# Patient Record
Sex: Male | Born: 1960 | Race: White | Hispanic: No | Smoking: Never smoker
Health system: Southern US, Community
[De-identification: ages and names within clinical notes are randomized; demographics above are authoritative.]

## PROBLEM LIST (undated history)

## (undated) DIAGNOSIS — I7789 Other specified disorders of arteries and arterioles: Secondary | ICD-10-CM

## (undated) DIAGNOSIS — Z8701 Personal history of pneumonia (recurrent): Secondary | ICD-10-CM

## (undated) DIAGNOSIS — G20 Parkinson's disease: Secondary | ICD-10-CM

## (undated) DIAGNOSIS — R7989 Other specified abnormal findings of blood chemistry: Secondary | ICD-10-CM

## (undated) DIAGNOSIS — E079 Disorder of thyroid, unspecified: Secondary | ICD-10-CM

## (undated) HISTORY — PX: CHOLECYSTECTOMY: SHX55

## (undated) HISTORY — PX: LASIK: SHX215

---

## 2015-10-19 ENCOUNTER — Emergency Department (HOSPITAL_COMMUNITY): Payer: Self-pay

## 2015-10-19 ENCOUNTER — Emergency Department (HOSPITAL_COMMUNITY)
Admission: EM | Admit: 2015-10-19 | Discharge: 2015-10-19 | Disposition: A | Discharge status: Home / Self Care | Payer: Self-pay | Attending: Emergency Medicine | Admitting: Emergency Medicine

## 2015-10-19 ENCOUNTER — Encounter (HOSPITAL_COMMUNITY): Payer: Self-pay

## 2015-10-19 DIAGNOSIS — Z8679 Personal history of other diseases of the circulatory system: Secondary | ICD-10-CM | POA: Insufficient documentation

## 2015-10-19 DIAGNOSIS — E291 Testicular hypofunction: Secondary | ICD-10-CM | POA: Insufficient documentation

## 2015-10-19 DIAGNOSIS — G20 Parkinson's disease: Secondary | ICD-10-CM | POA: Insufficient documentation

## 2015-10-19 DIAGNOSIS — B303 Acute epidemic hemorrhagic conjunctivitis (enteroviral): Secondary | ICD-10-CM

## 2015-10-19 DIAGNOSIS — Z79899 Other long term (current) drug therapy: Secondary | ICD-10-CM | POA: Insufficient documentation

## 2015-10-19 DIAGNOSIS — H1132 Conjunctival hemorrhage, left eye: Secondary | ICD-10-CM | POA: Insufficient documentation

## 2015-10-19 DIAGNOSIS — E079 Disorder of thyroid, unspecified: Secondary | ICD-10-CM | POA: Insufficient documentation

## 2015-10-19 HISTORY — DX: Other specified abnormal findings of blood chemistry: R79.89

## 2015-10-19 HISTORY — DX: Disorder of thyroid, unspecified: E07.9

## 2015-10-19 HISTORY — DX: Parkinson's disease: G20

## 2015-10-19 HISTORY — DX: Other specified disorders of arteries and arterioles: I77.89

## 2015-10-19 LAB — CBC WITH DIFFERENTIAL/PLATELET
Basophils Absolute: 0 10*3/uL (ref 0.0–0.1)
Basophils Relative: 0 %
EOS ABS: 0.1 10*3/uL (ref 0.0–0.7)
Eosinophils Relative: 2 %
HEMATOCRIT: 47.5 % (ref 39.0–52.0)
HEMOGLOBIN: 15.8 g/dL (ref 13.0–17.0)
LYMPHS ABS: 1.4 10*3/uL (ref 0.7–4.0)
LYMPHS PCT: 26 %
MCH: 30.6 pg (ref 26.0–34.0)
MCHC: 33.3 g/dL (ref 30.0–36.0)
MCV: 92.1 fL (ref 78.0–100.0)
MONOS PCT: 17 %
Monocytes Absolute: 0.9 10*3/uL (ref 0.1–1.0)
NEUTROS PCT: 55 %
Neutro Abs: 3.1 10*3/uL (ref 1.7–7.7)
Platelets: 187 10*3/uL (ref 150–400)
RBC: 5.16 MIL/uL (ref 4.22–5.81)
RDW: 12.8 % (ref 11.5–15.5)
WBC: 5.5 10*3/uL (ref 4.0–10.5)

## 2015-10-19 LAB — BASIC METABOLIC PANEL
Anion gap: 9 (ref 5–15)
BUN: 10 mg/dL (ref 6–20)
CHLORIDE: 102 mmol/L (ref 101–111)
CO2: 26 mmol/L (ref 22–32)
CREATININE: 0.98 mg/dL (ref 0.61–1.24)
Calcium: 9 mg/dL (ref 8.9–10.3)
GFR calc non Af Amer: >60 mL/min (ref >60)
Glucose, Bld: 80 mg/dL (ref 65–99)
POTASSIUM: 3.7 mmol/L (ref 3.5–5.1)
SODIUM: 137 mmol/L (ref 135–145)

## 2015-10-19 LAB — I-STAT CREATININE, ED: CREATININE: 1.1 mg/dL (ref 0.61–1.24)

## 2015-10-19 MED ORDER — CARBIDOPA-LEVODOPA 25-100 MG PO TABS: 1.0000 | ORAL_TABLET | Freq: Four times a day (QID) | ORAL | Status: DC

## 2015-10-19 MED ORDER — FLUORESCEIN SODIUM 1 MG OP STRP
1.0000 | ORAL_STRIP | Freq: Once | OPHTHALMIC | Status: AC
  Administered 2015-10-19: 1 via OPHTHALMIC
  Filled 2015-10-19: qty 1

## 2015-10-19 MED ORDER — ROPINIROLE HCL 1 MG PO TABS
8.0000 mg | ORAL_TABLET | Freq: Two times a day (BID) | ORAL | Status: DC
  Filled 2015-10-19: qty 8

## 2015-10-19 MED ORDER — PROPARACAINE HCL 0.5 % OP SOLN
1.0000 [drp] | Freq: Once | OPHTHALMIC | Status: AC
  Administered 2015-10-19: 1 [drp] via OPHTHALMIC
  Filled 2015-10-19: qty 15

## 2015-10-19 MED ORDER — IOHEXOL 300 MG/ML  SOLN
75.0000 mL | Freq: Once | INTRAMUSCULAR | Status: AC | PRN
  Administered 2015-10-19: 75 mL via INTRAVENOUS

## 2015-10-19 MED ORDER — POLYMYXIN B-TRIMETHOPRIM 10000-0.1 UNIT/ML-% OP SOLN: 2.0000 [drp] | Freq: Four times a day (QID) | OPHTHALMIC | Status: DC

## 2015-10-19 MED ORDER — ROPINIROLE HCL ER 8 MG PO TB24: 8.0000 mg | ORAL_TABLET | Freq: Two times a day (BID) | ORAL | Status: DC

## 2015-10-19 MED ORDER — CARBIDOPA-LEVODOPA 25-100 MG PO TABS
1.0000 | ORAL_TABLET | Freq: Four times a day (QID) | ORAL | Status: DC
  Administered 2015-10-19: 1 via ORAL
  Filled 2015-10-19 (×3): qty 1

## 2015-10-19 NOTE — ED Provider Notes (Signed)
CSN: [id number]     Arrival date & time 10/19/15  1557 History  By signing my name below, I, [name], attest that this documentation has been prepared under the direction and in the presence of non-physician practitioner, Arthor Captain, PA-C. Electronically Signed: [name], Scribe. 10/19/2015. 5:53 PM.  Chief Complaint  Patient presents with  . eye redness     The history is provided by the patient. No language interpreter was used.     HPI Comments:  [name] [name] is a 55 y.o. male who presents to the Emergency Department complaining of moderate-severe left eye and eyelid redness that has progressively worsened since onset 4 days. He notes associated  mild throbbing eye pain when he touches the eye, mild photophobia, and watering of the eye. He has been evaluated at urgent care and was discharged with antibiotic, tobramycin, which he has been using without relief. He denies injury. Pt also denies use of contacts but notes he wears corrective lenses. No alleviating factors noted. Patient remarks that he has had some visual changes over the past 3 months that he thought was due to his parkisons. He descries it as "problems focusing."   Past Medical History  Diagnosis Date  . Parkinson's disease (HCC)   . Enlarged aorta (HCC)   . Thyroid disease   . Low testosterone    Past Surgical History  Procedure Laterality Date  . Cholecystectomy     Family History  Problem Relation Age of Onset  . Diabetes Mother   . Hypertension Mother    Social History  Substance Use Topics  . Smoking status: Never Smoker   . Smokeless tobacco: Never Used  . Alcohol Use: Yes     Comment: rarely    Review of Systems  Constitutional: Negative for fever and chills.  Eyes: Positive for photophobia, pain, discharge and redness.  Ten systems reviewed and are negative for acute change, except as noted in the HPI.   Allergies  Review of patient's allergies indicates no known  allergies.  Home Medications   Prior to Admission medications   Medication Sig Start Date End Date Taking? Authorizing Provider  carbidopa-levodopa (SINEMET IR) 25-100 MG tablet Take 1 tablet by mouth 4 (four) times daily.   Yes Historical Provider, MD  levothyroxine (SYNTHROID, LEVOTHROID) 50 MCG tablet Take 50 mcg by mouth daily before breakfast.   Yes Historical Provider, MD  PARoxetine (PAXIL) 20 MG tablet Take 20 mg by mouth daily.   Yes Historical Provider, MD  rOPINIRole (REQUIP XL) 8 MG 24 hr tablet Take 8 mg by mouth 2 (two) times daily.   Yes Historical Provider, MD  testosterone (ANDROGEL) 50 MG/5GM (1%) GEL Place 5 g onto the skin daily.   Yes Historical Provider, MD   BP 146/80 mmHg  Pulse 71  Temp(Src) 97.8 F (36.6 C) (Oral)  Resp 16  Ht  (1.854 m)  Wt 320 lb (145.151 kg)  BMI 42.23 kg/m2  SpO2 97% Physical Exam  Constitutional: He is oriented to person, place, and time. He appears well-developed and well-nourished. No distress.  HENT:  Head: Normocephalic and atraumatic.      Eyes: Conjunctivae are normal. No scleral icterus.  Visual acuity/ Pressure OD:20/25; OS: 20/30;  Left eye with sig swellig and erythema of the upper lid There is violaceous discoloration at the medial canthus in along the border of the inferior orbit similar to a " black eye" Impressive chemosis with diffuse subcojunctival hemorrhage. The chemosis protrudes  from the eye and the patient is unable to fully shut the lid because of the swelling. He is EOMI, PERRL and no photophobia No fluorescein uptake on exam.   Exam is limited due to severe swelling.  Neck: Normal range of motion. Neck supple.  Cardiovascular: Normal rate, regular rhythm and normal heart sounds.   Pulmonary/Chest: Effort normal and breath sounds normal. No respiratory distress.  Abdominal: Soft. He exhibits no distension. There is no tenderness.  Musculoskeletal: He exhibits no edema.  Neurological:  He is alert and oriented to person, place, and time.  Skin: Skin is warm and dry. He is not diaphoretic.  Psychiatric: He has a normal mood and affect. His behavior is normal.  Nursing note and vitals reviewed.   ED Course  Procedures   DIAGNOSTIC STUDIES:  Oxygen Saturation is 97% on RA, normal by my interpretation.    COORDINATION OF CARE:  5:35 PM Discussed treatment plan with pt at bedside and pt agreed to plan.  Labs Review Labs Reviewed - No data to display  Imaging Review No results found. I have personally reviewed and evaluated these images and lab results as part of my medical decision-making.    MDM   Final diagnoses:  None    Patiet with sig eye complaint as described above. No overt proptosis.  Patient seen in shared visit with attending physician. Will check CT orits to r/o retro orbital mass or infection. i have given sign out to [employer].  I personally performed the services described in this documentation, which was scribed in my presence. The recorded information has been reviewed and is accurate.         Arthor Captain, PA-C 10/19/15 2036  Azalia Bilis, MD 10/20/15 [phone]

## 2015-10-19 NOTE — Discharge Instructions (Signed)
Viral Conjunctivitis Viral conjunctivitis is an inflammation of the clear membrane that covers the white part of your eye and the inner surface of your eyelid (conjunctiva). The inflammation is caused by a viral infection. The blood vessels in the conjunctiva become inflamed, causing the eye to become red or pink, and often itchy. Viral conjunctivitis can easily be passed from one person to another (contagious). CAUSES  Viral conjunctivitis is caused by a virus. A virus is a type of contagious germ. It can be spread by touching objects that have been contaminated with the virus, such as doorknobs or towels.  SYMPTOMS  Symptoms of viral conjunctivitis may include:   Eye redness.  Tearing or watery eyes.  Itchy eyes.  Burning feeling in the eyes.  Clear drainage from the eye.  Swollen eyelids.  A gritty feeling in the eye.  Light sensitivity. DIAGNOSIS  Viral conjunctivitis may be diagnosed with a medical history and physical exam. If you have discharge from your eye, the discharge may be tested to rule out other causes of conjunctivitis.  TREATMENT  Viral conjunctivitis does not respond to medicines that kill bacteria (antibiotics). Treatment for viral conjunctivitis is directed at stopping a bacterial infection from developing in addition to the viral infection. Treatment also aims to relieve your symptoms, such as itching. This may be done with antihistamine drops or other eye medicines. HOME CARE INSTRUCTIONS  Take medicines only as directed by your health care provider.  Avoid touching or rubbing your eyes.  Apply a warm, clean washcloth to your eye for 10-20 minutes, 3-4 times per day.  If you wear contact lenses, do not wear them until the inflammation is gone and your health care provider says it is safe to wear them again. Ask your health care provider how to sterilize or replace your contact lenses before using them again. Wear glasses until you can resume wearing  contacts.  Avoid wearing eye makeup until the inflammation is gone. Throw away any old eye cosmetics that may be contaminated.  Change or wash your pillowcase every day.  Do not share towels or washcloths. This may spread the infection.  Wash your hands often with soap and water. Use paper towels to dry your hands.  Gently wipe away any drainage from your eye with a warm, wet washcloth or a cotton ball.  Be very careful to avoid touching the edge of the eyelid with the eye drop bottle or ointment tube when applying medicines to the affected eye. This will stop you from spreading the infection to the other eye or to other people. SEEK MEDICAL CARE IF:   Your symptoms do not improve with treatment.  You have increased pain.  Your vision becomes blurry.  You have a fever.  You have facial pain, redness, or swelling.  You have new symptoms.  Your symptoms get worse.   This information is not intended to replace advice given to you by your health care provider. Make sure you discuss any questions you have with your health care provider.   Document Released: 11/10/2002 Document Revised: 02/11/2006 Document Reviewed: 06/01/2014 Elsevier Interactive Patient Education 2016 [employer]  

## 2015-10-19 NOTE — ED Notes (Signed)
Patient c/o eye redness and swelling x 3 days. Patient went to an UC and was given Tobrax 2 days ago. Patient states the redness and swelling has gotten worse.

## 2015-10-19 NOTE — ED Provider Notes (Signed)
Patient to the ER seen by Arthor Captain, PA-C for severe left eye and eyelid redness and swelling that has been worsening over the past 4 days. He has pain associated, mild photophobia, and watering of the eye. He was seen at the UC and given Tobramycin drops but it has not helped. He has had visual changes over the past 3 months which he has attributed to his Parkinsons' disease.  "Visual acuity/ Pressure OD:20/25; OS: 20/30;  Left eye with sig swellig and erythema of the upper lid There is violaceous discoloration at the medial canthus in along the border of the inferior orbit similar to a " black eye" Impressive chemosis with diffuse subcojunctival hemorrhage. The chemosis protrudes from the eye and the patient is unable to fully shut the lid because of the swelling. He is EOMI, PERRL and no photophobia No fluorescein uptake on exam.  Exam is limited due to severe swelling."   CT Orbits W/CM (Final result) Result time: 10/19/15 20:26:33   Final result by Rad Results In Interface (10/19/15 20:26:33)   Narrative:   CLINICAL DATA: 55 year old male with throbbing left eye pain, and left eyelid redness and swelling.  EXAM: CT ORBITS WITH CONTRAST  TECHNIQUE: Multidetector CT imaging of the orbits was performed following the bolus administration of intravenous contrast.  CONTRAST: 75mL OMNIPAQUE IOHEXOL 300 MG/ML SOLN  COMPARISON: None.  FINDINGS: Hyperemia and thickening of the left palpebra. Additionally, there is focal fluid containing a few small foci of air/gas in the region of the anterior chamber of the eye. The lens and posterior chamber are intact. The retrobulbar fat is pristine. No evidence of orbital extension. The right RA is unremarkable. No metallic or dense foreign body identified. The remainder of the imaged face and intracranial contents are unremarkable. The visualized mastoid air cells and paranasal sinuses are well aerated and  normal.  IMPRESSION: 1. Hyperemia and edema of the left palpebra consistent with edema or cellulitis. 2. Abnormal fluid and gas in the anterior aspect of the left globe. This may represent focal hydrops or blistering of the cornea, or potentially an infectious/inflammatory process in the anterior chamber. There is no evidence of extension of the process into the posterior chamber or orbit.   Electronically Signed By: Malachy Moan M.D. On: 10/19/2015 20:26    Dr. Randon Goldsmith, Cheree Ditto, hemorrhagic conjunctivitis - Viral,  polytrim QID, cold compresses for comfort, Motrin, Ibuprofen. Vivene. Can follow-up with regular Optometrist or with DR. Lyles. This infection can last weeks.  Discussed this with patient and given return precautions.   Filed Vitals:   10/19/15 1627 10/19/15 1829  BP: 146/80 163/97  Pulse: 71 64  Temp: 97.8 F (36.6 C)   Resp: 16 [address] PA-C 10/19/15 2139  Azalia Bilis, MD 10/20/15 [phone]

## 2015-11-17 DIAGNOSIS — I7789 Other specified disorders of arteries and arterioles: Secondary | ICD-10-CM | POA: Insufficient documentation

## 2016-04-11 ENCOUNTER — Encounter: Payer: Self-pay | Admitting: Neurology

## 2016-04-16 ENCOUNTER — Ambulatory Visit (INDEPENDENT_AMBULATORY_CARE_PROVIDER_SITE_OTHER): Payer: BC Managed Care – PPO | Admitting: Neurology

## 2016-04-16 ENCOUNTER — Encounter: Payer: Self-pay | Admitting: Neurology

## 2016-04-16 VITALS — BP 134/80 | HR 73 | Ht 73.0 in | Wt 335.0 lb

## 2016-04-16 DIAGNOSIS — G20A1 Parkinson's disease without dyskinesia, without mention of fluctuations: Secondary | ICD-10-CM

## 2016-04-16 DIAGNOSIS — G20 Parkinson's disease: Secondary | ICD-10-CM

## 2016-04-16 MED ORDER — ROPINIROLE HCL 5 MG PO TABS: 5.0000 mg | ORAL_TABLET | Freq: Three times a day (TID) | ORAL | 3 refills | Status: DC

## 2016-04-16 MED ORDER — CARBIDOPA-LEVODOPA ER 50-200 MG PO TBCR: 1.0000 | EXTENDED_RELEASE_TABLET | Freq: Every day | ORAL | 1 refills | Status: DC

## 2016-04-16 NOTE — Patient Instructions (Addendum)
1. Continue Carbidopa Levodopa IR 25/100 - 1 tablet at 5 am, 1 tablet at 9 am, 1 tablet at 2 pm, 1 tablet at 5 pm Start Carbidopa Levodopa 50/200 at bedtime  2. Stop XL Requip Start Requip 5 mg tablets - 1 tablet three times daily  3. Follow up in 3 months
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
## 2016-07-14 NOTE — Progress Notes (Deleted)
[name] [name] was seen today in the movement disorders clinic for neurologic consultation at the request of [name],[name], MD.  The consultation is for the evaluation of PD.  He was previously seen in [place] (but that was about 5 years ago) and seen for a one time visit 11/17/15 with Dr. [name] but there is no note in Care Everywhere and when we called his office we were told to call medical records [facility] and when we called medical records, we were told to call Dr. [name].  Pt reports that his first sx was when he was about 55 years old and that was R toe tapping/R toe tremor followed by a sense of stiffness.  He was seen at [facility].   He was first placed on requip XL and worked ultimately to requip XL 8 mg bid.  This was reduced because of LE edema by Dr. [name] but he reports that he was placed on lasix recently and that has helped the LE edema.  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   He is also on carbidopa/levodopa 25/100 tid-qid (5am/10:30/4pm/sometimes before bed).  He was on Azilect for a while but has been off that since he left [place] about 5 years ago.    07/17/16 update:  Pt f/u today.  We changed him from requip XL 8 mg bid to requip 5 mg tid (primarily because of issues obtaining the XL).  I also increased his levodopa and asked him to take carbidopa/levodopa 25/100 at 5 AM/9-10 AM/1-2pm/5-6pm and then we added carbidopa/levodopa 50/200 at night as he was having toe cramping and right leg pulling at night.  He states that ***.  Neuroimaging has previously been performed.  It is not available for my review today.  States that it was normal  PREVIOUS MEDICATIONS: Sinemet, Requip and Azilect, paxil  ALLERGIES:  No Known Allergies  CURRENT MEDICATIONS:  Outpatient Encounter Prescriptions as of 07/17/2016  Medication Sig  . carbidopa-levodopa (SINEMET CR) 50-200 MG  tablet Take 1 tablet by mouth at bedtime.  . carbidopa-levodopa (SINEMET IR) 25-100 MG tablet Take 1 tablet by mouth 4 (four) times daily.  . clonazePAM (KLONOPIN) 0.5 MG tablet 1 pill qhs prn  . furosemide (LASIX) 20 MG tablet Take 20 mg by mouth daily.  . ropinirole (REQUIP) 5 MG tablet Take 1 tablet (5 mg total) by mouth 3 (three) times daily.  . sildenafil (VIAGRA) 100 MG tablet Take 100 mg by mouth as needed for erectile dysfunction.   No facility-administered encounter medications on file as of 07/17/2016.     PAST MEDICAL HISTORY:   Past Medical History:  Diagnosis Date  . Enlarged aorta (HCC)   . Low testosterone   . Parkinson's disease (HCC)   . Thyroid disease     PAST SURGICAL HISTORY:   Past Surgical History:  Procedure Laterality Date  . CHOLECYSTECTOMY    . LASIK      SOCIAL HISTORY:   Social History   Social History  . Marital status: Legally Separated    Spouse name: N/A  . Number of children: N/A  . Years of education: N/A   Occupational History  . dentist     Ambulance personCU instructor   Social History Main Topics  . Smoking status: Never Smoker  . Smokeless tobacco: Never Used  . Alcohol use Yes     Comment:  once a week  . Drug use: No  . Sexual activity: Not on file   Other Topics Concern  . Not on file   Social History Narrative  . No narrative on file    FAMILY HISTORY:   Family Status  Relation Status  . Mother Alive  . Father Deceased  . Sister Alive   2, healthy  . Brother Deceased at age 149  . Brother Alive    ROS:  Some DOE that attributes to being out of shape.  Feels that R side weaker than the left.  A complete 10 system review of systems was obtained and was unremarkable apart from what is mentioned above.  PHYSICAL EXAMINATION:    VITALS:   There were no vitals filed for this visit.  GEN:  The patient appears stated age and is in NAD. HEENT:  Normocephalic, atraumatic.  The mucous membranes are moist. The superficial  temporal arteries are without ropiness or tenderness. CV:  RRR Lungs:  CTAB Neck/HEME:  There are no carotid bruits bilaterally.  PT DID NOT TAKE HIS PD MEDS TODAY   Neurological examination:  Orientation: The patient is alert and oriented x3. Fund of knowledge is appropriate.  Recent and remote memory are intact.  Attention and concentration are normal.    Able to name objects and repeat phrases. Cranial nerves: There is good facial symmetry. There is significant facial hypomimia.  Pupils are equal round and reactive to light bilaterally. Fundoscopic exam reveals clear margins bilaterally. Extraocular muscles are intact. The visual fields are full to confrontational testing. The speech is fluent and clear. Soft palate rises symmetrically and there is no tongue deviation. Hearing is intact to conversational tone. Sensation: Sensation is intact to light and pinprick throughout (facial, trunk, extremities). Vibration is intact at the bilateral big toe. There is no extinction with double simultaneous stimulation. There is no sensory dermatomal level identified. Motor: Strength is 5/5 in the bilateral upper and lower extremities.   Shoulder shrug is equal and symmetric.  There is no pronator drift. Deep tendon reflexes: Deep tendon reflexes are 1/4 at the bilateral biceps, triceps, brachioradialis, patella and achilles. Plantar responses are downgoing bilaterally.  Movement examination: Tone: There is mod increased tone in the RUE and RLE.  There is mild increased tone in the LUE and normal tone in the LLE.  upper extremities.  Abnormal movements: There is RLE tremor.  There is rare LUE tremor. Coordination:  There is decremation with RAM's, with any form of RAMS on the right, including alternating supination and pronation of the forearm, hand opening and closing, finger taps, heel taps and toe taps and also seen with finger taps and alternation of the supination/pronation of the forearm on the L Gait  and Station: The patient has no difficulty arising out of a deep-seated chair without the use of the hands. The patient's stride length is normal with decreased arm swing on the R.  The patient has a negative pull test.      ASSESSMENT/PLAN:  1.  Idiopathic Parkinson's disease.  The patient has tremor, bradykinesia, rigidity and mild postural instability.  -We discussed the diagnosis as well as pathophysiology of the disease.  We discussed treatment options as well as prognostic indicators.  Patient education was provided.  -We decided to continue his carbidopa/levodopa 25/100 IR at 5 AM/9-10 AM/1-2pm/5-6pm  -continue carbidopa/levodopa 50/200 at night for toe cramping and right leg pulling at night.  -continue requip 5 mg tid.  -We discussed community  resources in the area including patient support groups and community exercise programs for PD and pt education was provided to the patient.  He works full time, so he really could only do the rock study boxing and ACT gym and he was given information on those programs.  -He asked me about DBS therapy.  I talked to him about that therapy.  He is experiencing some freezing and motor fluctuations, including on/off, so he may be a candidate, but I want to rework his medications first.  In addition, he works full time, and most patients do not resume full-time employment following DBS therapy.  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  2.  Follow up is anticipated in the next few months, sooner should new neurologic issues arise.

## 2016-07-17 ENCOUNTER — Ambulatory Visit: Payer: BC Managed Care – PPO | Admitting: Neurology

## 2016-09-25 NOTE — Progress Notes (Signed)
[name] [name] was seen today in the movement disorders clinic for neurologic consultation at the request of [name],[name], MD.  The consultation is for the evaluation of PD.  He was previously seen in [place] (but that was about 5 years ago) and seen for a one time visit 11/17/15 with Dr. Rubin Payor but there is no note in Care Everywhere and when we called his office we were told to call medical records at [facility] and when we called medical records, we were told to call Dr. Rubin Payor.  Pt reports that his first sx was when he was about 56 years old and that was R toe tapping/R toe tremor followed by a sense of stiffness.  He was seen at [facility]   He was first placed on requip XL and worked ultimately to requip XL 8 mg bid.  This was reduced because of LE edema by Dr. Rubin Payor but he reports that he was placed on lasix recently and that has helped the LE edema.  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   He is also on carbidopa/levodopa 25/100 tid-qid (5am/10:30/4pm/sometimes before bed).  He was on Azilect for a while but has been off that since he left [place] about 5 years ago.    09/27/16 update:  Patient returns today for follow-up.  We changed him from Requip XL to regular ropinirole and he is on 5 mg 3 times per day (but about 4 days a week he is taking 4 of them).  He denies compulsive behaviors or sleep attacks.  I encouraged him last visit to take carbidopa/levodopa 25/100 at the following times: 5 AM/9-10 AM/1-2pm/5-6pm (states that he is actually taking 2 po tid-qid) and then we added carbidopa/levodopa 50/200 at night as he was having toe cramping and right leg pulling at night (he takes that about 3 nights a week). Toe curling is some better.  Dreaming at night is a bit better.   He states that meds wear off after about 2 hours and he doesn't feel great and he becomes more stiff and finds it  difficult to do his work.  Pt denies falls.  Pt denies lightheadedness, near syncope.  No hallucinations.  Mood has been good.  Neuroimaging has previously been performed.  It is not available for my review today.  States that it was normal  PREVIOUS MEDICATIONS: Sinemet, Requip and Azilect, paxil  ALLERGIES:  No Known Allergies  CURRENT MEDICATIONS:  Outpatient Encounter Prescriptions as of 09/27/2016  Medication Sig  . carbidopa-levodopa (SINEMET CR) 50-200 MG tablet Take 1 tablet by mouth at bedtime.  . carbidopa-levodopa (SINEMET IR) 25-100 MG tablet Take 1 tablet by mouth 4 (four) times daily.  . clonazePAM (KLONOPIN) 0.5 MG tablet 1 pill qhs prn  . furosemide (LASIX) 20 MG tablet Take 20 mg by mouth daily.  . ropinirole (REQUIP) 5 MG tablet Take 1 tablet (5 mg total) by mouth 3 (three) times daily.  . sildenafil (VIAGRA) 100 MG tablet Take 100 mg by mouth as needed for erectile dysfunction.   No facility-administered encounter medications on file as of 09/27/2016.     PAST MEDICAL HISTORY:   Past Medical History:  Diagnosis Date  . Enlarged aorta (HCC)   . Low testosterone   . Parkinson's disease (HCC)   . Thyroid disease     PAST SURGICAL HISTORY:   Past Surgical  History:  Procedure Laterality Date  . CHOLECYSTECTOMY    . LASIK      SOCIAL HISTORY:   Social History   Social History  . Marital status: Legally Separated    Spouse name: N/A  . Number of children: N/A  . Years of education: N/A   Occupational History  . dentist     Ambulance personCU instructor   Social History Main Topics  . Smoking status: Never Smoker  . Smokeless tobacco: Never Used  . Alcohol use Yes     Comment: once a week  . Drug use: No  . Sexual activity: Not on file   Other Topics Concern  . Not on file   Social History Narrative  . No narrative on file    FAMILY HISTORY:   Family Status  Relation Status  . Mother Alive  . Father Deceased  . Sister Alive   2, healthy  . Brother  Deceased at age 57  . Brother Alive    ROS:  Some DOE that attributes to being out of shape. A complete 10 system review of systems was obtained and was unremarkable apart from what is mentioned above.  PHYSICAL EXAMINATION:    VITALS:   Vitals:   09/27/16 0805  BP: 130/86  Pulse: 73  Weight: (!) 340 lb (154.2 kg)  Height: 6\' 1"  (1.854 m)    GEN:  The patient appears stated age and is in NAD. HEENT:  Normocephalic, atraumatic.  The mucous membranes are moist. The superficial temporal arteries are without ropiness or tenderness. CV:  RRR Lungs:  CTAB.  Has DOE Neck/HEME:  There are no carotid bruits bilaterally.  Neurological examination:  Orientation: The patient is alert and oriented x3.  Cranial nerves: There is good facial symmetry. There is significant facial hypomimia.  Pupils are equal round and reactive to light bilaterally. Fundoscopic exam reveals clear margins bilaterally. Extraocular muscles are intact. The visual fields are full to confrontational testing. The speech is fluent and clear. Soft palate rises symmetrically and there is no tongue deviation. Hearing is intact to conversational tone. Sensation: Sensation is intact to light touch throughout Motor: Strength is 5/5 in the bilateral upper and lower extremities.   Shoulder shrug is equal and symmetric.  There is no pronator drift. Deep tendon reflexes: Deep tendon reflexes are 1/4 at the bilateral biceps, triceps, brachioradialis, patella and achilles. Plantar responses are downgoing bilaterally.  Movement examination: Tone: There is mod increased tone in the RUE but there is normal tone elsewhere today.   Abnormal movements: There is no tremor today Coordination:  There is decremation with foot taps on the right and mildly with alternation of supination/pronation of the forearm Gait and Station: The patient has no difficulty arising out of a deep-seated chair without the use of the hands. The patient's stride  length is normal with decreased arm swing on the R.  The patient has a negative pull test.      ASSESSMENT/PLAN:  1.  Idiopathic Parkinson's disease.  The patient has tremor, bradykinesia, rigidity and mild postural instability.  -We discussed the diagnosis as well as pathophysiology of the disease.  We discussed treatment options as well as prognostic indicators.  Patient education was provided.  -Increase carbidopa/levodopa 25/100 so taking:  3 tablets of carbidopa/levodopa 25/100 in the AM followed by a total of up to 7 other tablets during the day.  Think that spreading this out more evenly will help with wearing off  -We discussed community resources  in the area including patient support groups and community exercise programs for PD and pt education was provided to the patient.  He works full time, so he really could only do the rock study boxing and ACT gym and he was given information on those programs.  -He asked me about DBS therapy again.  I talked to him about that therapy.  He is experiencing some freezing and motor fluctuations, including on/off, so he may be a candidate.  Talked about medtronic vs newer devices on market  -continue Requip, 5 mg 3-4 times per day.  At this point, he notes no compulsive behaviors.  He really does not think that the lower extremity edema was from the Requip.  He backed down to half the dosage and noted no change.  He does think that the addition of Lasix helped, however.  2.  Follow up is anticipated in the next few months, sooner should new neurologic issues arise.  Much greater than 50% of this visit was spent in counseling and coordinating care.  Total face to face time:  35 min

## 2016-09-27 ENCOUNTER — Encounter: Payer: Self-pay | Admitting: Neurology

## 2016-09-27 ENCOUNTER — Ambulatory Visit (INDEPENDENT_AMBULATORY_CARE_PROVIDER_SITE_OTHER): Payer: BC Managed Care – PPO | Admitting: Neurology

## 2016-09-27 VITALS — BP 130/86 | HR 73 | Ht 73.0 in | Wt 340.0 lb

## 2016-09-27 DIAGNOSIS — G20 Parkinson's disease: Secondary | ICD-10-CM

## 2016-09-27 MED ORDER — CARBIDOPA-LEVODOPA 25-100 MG PO TABS: 2.0000 | ORAL_TABLET | Freq: Every day | ORAL | 5 refills | Status: DC

## 2016-09-27 NOTE — Patient Instructions (Addendum)
Take 3 tablets of carbidopa/levodopa 25/100 in the AM followed by a total of up to 7 other tablets during the day.   Take carbidopa/levodopa 50/200 at bedtime Take requip 5 mg 3-4 times per day

## 2016-09-30 ENCOUNTER — Encounter: Payer: Self-pay | Admitting: Neurology

## 2016-10-01 MED ORDER — AMBULATORY NON FORMULARY MEDICATION: 0 refills | Status: DC

## 2016-10-12 ENCOUNTER — Other Ambulatory Visit: Payer: Self-pay | Admitting: Neurology

## 2016-11-01 ENCOUNTER — Telehealth: Payer: Self-pay

## 2016-11-01 NOTE — Telephone Encounter (Signed)
SENT NOTES TO SCHEDULING 

## 2017-01-22 NOTE — Progress Notes (Addendum)
[name] [name] was seen today in the movement disorders clinic for neurologic consultation at the request of [name], [name], MD.  The consultation is for the evaluation of PD.  He was previously seen in [place] (but that was about 5 years ago) and seen for a one time visit 11/17/15 with Dr. Rubin Payor but there is no note in Care Everywhere and when we called his office we were told to call medical records at [facility] and when we called medical records, we were told to call Dr. Rubin Payor.  Pt reports that his first sx was when he was about 56 years old and that was R toe tapping/R toe tremor followed by a sense of stiffness.  He was seen at [facility].   He was first placed on requip XL and worked ultimately to requip XL 8 mg bid.  This was reduced because of LE edema by Dr. Rubin Payor but he reports that he was placed on lasix recently and that has helped the LE edema.  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   He is also on carbidopa/levodopa 25/100 tid-qid (5am/10:30/4pm/sometimes before bed).  He was on Azilect for a while but has been off that since he left [place] about 5 years ago.    09/27/16 update:  Patient returns today for follow-up.  We changed him from Requip XL to regular ropinirole and he is on 5 mg 3 times per day (but about 4 days a week he is taking 4 of them).  He denies compulsive behaviors or sleep attacks.  I encouraged him last visit to take carbidopa/levodopa 25/100 at the following times: 5 AM/9-10 AM/1-2pm/5-6pm (states that he is actually taking 2 po tid-qid) and then we added carbidopa/levodopa 50/200 at night as he was having toe cramping and right leg pulling at night (he takes that about 3 nights a week). Toe curling is some better.  Dreaming at night is a bit better.   He states that meds wear off after about 2 hours and he doesn't feel great and he becomes more stiff and finds it  difficult to do his work.  Pt denies falls.  Pt denies lightheadedness, near syncope.  No hallucinations.  Mood has been good.  01/25/17 update:  Patient is seen today in follow-up.  He is on ropinirole 5 mg, 3-4 times per day.  We increased his carbidopa/levodopa 25/100 last visit and he was told to take 3 tablets of carbidopa/levodopa 25/100 in the AM followed by a total of up to 7 other tablets during the day (averaging 8-10 during the day). Patient states that he does well in the early AM but if he has to do an extensive procedure, he becomes stiff and slow, partly because he can't take med on time. He is sometimes taking carbidopa/levodopa 50/200 at bedtime (takes it if he goes to bed early).  He has not had falls.  Rare lightheadedness but no near syncope.  Asks me about going to paxil.  Feels that it worked without SE (no sleepiness, weight gain, sweatiness)  Neuroimaging has previously been performed.  It is not available for my review today.  States that it was normal  PREVIOUS MEDICATIONS: Sinemet, Requip and Azilect, paxil  ALLERGIES:  No Known Allergies  CURRENT MEDICATIONS:  Outpatient Encounter Prescriptions as of 01/25/2017  Medication Sig  . carbidopa-levodopa (SINEMET CR) 50-200 MG tablet  TAKE ONE TABLET BY MOUTH EVERY NIGHT AT BEDTIME  . carbidopa-levodopa (SINEMET IR) 25-100 MG tablet Take 2 tablets by mouth 5 (five) times daily. (Patient taking differently: Take by mouth. 3 in the morning, 2 Q 5 hours)  . furosemide (LASIX) 20 MG tablet Take 20 mg by mouth daily.  . ropinirole (REQUIP) 5 MG tablet Take 1 tablet (5 mg total) by mouth 3 (three) times daily.  . sildenafil (VIAGRA) 100 MG tablet Take 100 mg by mouth as needed for erectile dysfunction.  . [DISCONTINUED] AMBULATORY NON FORMULARY MEDICATION Gym Membership   No facility-administered encounter medications on file as of 01/25/2017.     PAST MEDICAL HISTORY:   Past Medical History:  Diagnosis Date  . Enlarged aorta  (HCC)   . Low testosterone   . Parkinson's disease (HCC)   . Thyroid disease     PAST SURGICAL HISTORY:   Past Surgical History:  Procedure Laterality Date  . CHOLECYSTECTOMY    . LASIK      SOCIAL HISTORY:   Social History   Social History  . Marital status: Legally Separated    Spouse name: N/A  . Number of children: N/A  . Years of education: N/A   Occupational History  . dentist     Ambulance person   Social History Main Topics  . Smoking status: Never Smoker  . Smokeless tobacco: Never Used  . Alcohol use Yes     Comment: once a week  . Drug use: No  . Sexual activity: Not on file   Other Topics Concern  . Not on file   Social History Narrative  . No narrative on file    FAMILY HISTORY:   Family Status  Relation Status  . Mother Alive  . Father Deceased  . Sister Alive       2, healthy  . Brother Deceased at age 64  . Brother Alive    ROS:  Some DOE that attributes to being out of shape. A complete 10 system review of systems was obtained and was unremarkable apart from what is mentioned above.  PHYSICAL EXAMINATION:    VITALS:   Vitals:   01/25/17 0920  BP: 136/72  Pulse: 60  SpO2: 97%  Weight: (!) 338 lb (153.3 kg)  Height: 6\' 1"  (1.854 m)    GEN:  The patient appears stated age and is in NAD. HEENT:  Normocephalic, atraumatic.  The mucous membranes are moist. The superficial temporal arteries are without ropiness or tenderness. CV:  RRR Lungs:  CTAB.  Has DOE Neck/HEME:  There are no carotid bruits bilaterally.  Neurological examination:  Orientation: The patient is alert and oriented x3.  Cranial nerves: There is good facial symmetry. There is significant facial hypomimia.  Pupils are equal round and reactive to light bilaterally. Fundoscopic exam reveals clear margins bilaterally. Extraocular muscles are intact. The visual fields are full to confrontational testing. The speech is fluent and clear. Soft palate rises symmetrically and  there is no tongue deviation. Hearing is intact to conversational tone. Sensation: Sensation is intact to light touch throughout Motor: Strength is 5/5 in the bilateral upper and lower extremities.   Shoulder shrug is equal and symmetric.  There is no pronator drift. Deep tendon reflexes: Deep tendon reflexes are 1/4 at the bilateral biceps, triceps, brachioradialis, patella and achilles. Plantar responses are downgoing bilaterally.  Movement examination: Tone: There is normal tone in the UE.   Abnormal movements: There is no tremor today; there is  mild dyskinesia on the right Coordination:  There is decremation with foot taps on the right and mildly with alternation of supination/pronation of the forearm Gait and Station: The patient has no difficulty arising out of a deep-seated chair without the use of the hands. The patient's stride length is normal with decreased arm swing on the R.  The patient has a negative pull test.      Addendum:  Lab work received from primary care physician.  Lab work was dated 11/01/2016.  Sodium was 140, potassium 4.1, chloride 106, CO2 29, BUN 14 and creatinine 1.11.  Glucose was 102.  AST was 14, ALT 13.  Alkaline phosphatase 71.  Total cholesterol was 212 with triglycerides elevated at 299.  LDL was also elevated at 114.  TSH was normal at 1.47.  ASSESSMENT/PLAN:  1.  Idiopathic Parkinson's disease.  The patient has tremor, bradykinesia, rigidity and mild postural instability.  -We discussed the diagnosis as well as pathophysiology of the disease.  We discussed treatment options as well as prognostic indicators.  Patient education was provided.  -continue carbidopa/levodopa 25/100:  3 tablets of carbidopa/levodopa 25/100 in the AM followed by a total of up to 7 other tablets during the day.    -We discussed community resources in the area including patient support groups and community exercise programs for PD and pt education was provided to the patient.  He works  full time, so he really could only do the rock study boxing and ACT gym and he was given information on those programs.  -He will let me know when would like to consider DBS therapy  -discussed neuroderm product in the pipeline  -continue Requip, 5 mg 3-4 times per day.  At this point, he notes no compulsive behaviors.  He really does not think that the lower extremity edema was from the Requip.  He backed down to half the dosage and noted no change.  He does think that the addition of Lasix helped, however.  2.  Depression  -start Paxil - 10 mg daily.   He has been on this in the past and it worked well.  Risks, benefits, side effects and alternative therapies were discussed.  The opportunity to ask questions was given and they were answered to the best of my ability.  The patient expressed understanding and willingness to follow the outlined treatment protocols.  3.  Follow up is anticipated in the next few months, sooner should new neurologic issues arise.  Much greater than 50% of this visit was spent in counseling and coordinating care.  Total face to face time:  25 min

## 2017-01-25 ENCOUNTER — Encounter: Payer: Self-pay | Admitting: Neurology

## 2017-01-25 ENCOUNTER — Ambulatory Visit (INDEPENDENT_AMBULATORY_CARE_PROVIDER_SITE_OTHER): Payer: BC Managed Care – PPO | Admitting: Neurology

## 2017-01-25 VITALS — BP 136/72 | HR 60 | Ht 73.0 in | Wt 338.0 lb

## 2017-01-25 DIAGNOSIS — F330 Major depressive disorder, recurrent, mild: Secondary | ICD-10-CM

## 2017-01-25 DIAGNOSIS — G20 Parkinson's disease: Secondary | ICD-10-CM | POA: Diagnosis not present

## 2017-01-25 MED ORDER — PAROXETINE HCL 10 MG PO TABS: 10.0000 mg | ORAL_TABLET | Freq: Every day | ORAL | 1 refills | Status: DC

## 2017-05-31 ENCOUNTER — Ambulatory Visit: Payer: BC Managed Care – PPO | Admitting: Neurology

## 2017-06-28 ENCOUNTER — Other Ambulatory Visit: Payer: Self-pay | Admitting: Neurology

## 2017-07-01 ENCOUNTER — Other Ambulatory Visit: Payer: Self-pay | Admitting: Neurology

## 2017-07-04 ENCOUNTER — Other Ambulatory Visit: Payer: Self-pay | Admitting: Neurology

## 2017-07-12 ENCOUNTER — Other Ambulatory Visit: Payer: Self-pay | Admitting: Neurology
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
## 2017-07-18 ENCOUNTER — Ambulatory Visit (INDEPENDENT_AMBULATORY_CARE_PROVIDER_SITE_OTHER): Payer: BC Managed Care – PPO | Admitting: Neurology

## 2017-07-18 ENCOUNTER — Encounter: Payer: Self-pay | Admitting: Neurology

## 2017-07-18 VITALS — BP 116/74 | HR 64 | Ht 73.0 in | Wt 329.0 lb

## 2017-07-18 DIAGNOSIS — G20 Parkinson's disease: Secondary | ICD-10-CM | POA: Diagnosis not present

## 2017-07-18 DIAGNOSIS — F330 Major depressive disorder, recurrent, mild: Secondary | ICD-10-CM

## 2017-07-18 DIAGNOSIS — H532 Diplopia: Secondary | ICD-10-CM | POA: Diagnosis not present

## 2017-07-18 DIAGNOSIS — R202 Paresthesia of skin: Secondary | ICD-10-CM | POA: Diagnosis not present

## 2017-07-18 MED ORDER — CARBIDOPA-LEVODOPA 25-100 MG PO TABS: ORAL_TABLET | ORAL | 5 refills | Status: DC

## 2017-07-18 MED ORDER — PAROXETINE HCL 10 MG PO TABS: 10.0000 mg | ORAL_TABLET | Freq: Every day | ORAL | 1 refills | Status: DC

## 2017-07-18 MED ORDER — ROPINIROLE HCL ER 8 MG PO TB24: 8.0000 mg | ORAL_TABLET | Freq: Two times a day (BID) | ORAL | 1 refills | Status: DC

## 2017-07-18 NOTE — Patient Instructions (Addendum)
1.  Change requip to requip xl 8 mg twice per day.  2. Schedule EMG.   3. Referral sent to Dr. Theone Murdochim [facility] at [facility]. Appointment scheduled for 10/29/2017 at 8:20 am.  If this is not a good date/time please call [phone] to reschedule.

## 2017-08-08 ENCOUNTER — Encounter: Payer: BC Managed Care – PPO | Admitting: Neurology

## 2017-08-14 ENCOUNTER — Other Ambulatory Visit: Payer: Self-pay | Admitting: Neurology

## 2017-08-15 ENCOUNTER — Encounter: Payer: BC Managed Care – PPO | Admitting: Neurology

## 2017-08-22 ENCOUNTER — Telehealth: Payer: Self-pay | Admitting: Neurology

## 2017-08-22 ENCOUNTER — Ambulatory Visit (INDEPENDENT_AMBULATORY_CARE_PROVIDER_SITE_OTHER): Payer: BC Managed Care – PPO | Admitting: Neurology

## 2017-08-22 DIAGNOSIS — R202 Paresthesia of skin: Secondary | ICD-10-CM | POA: Diagnosis not present

## 2017-08-22 DIAGNOSIS — G5603 Carpal tunnel syndrome, bilateral upper limbs: Secondary | ICD-10-CM

## 2017-08-22 NOTE — Telephone Encounter (Signed)
Tried to call patient with no answer and mailbox full.

## 2017-08-22 NOTE — Procedures (Signed)
[facility]  [address], [address]  [place], [place] [address] Tel: [phone] Fax:  [phone] Test Date:  08/22/2017  Patient: [name] [name] DOB: 12-21-1960 Physician: Nita Sickleonika Edithe Dobbin, DO  Sex: Male Height: 6\' 1"  Ref Phys: Kerin Salenebecca Tat, DO  ID#: [id number] Temp: 34.5C Technician:    Patient Complaints: This is a 56 year-old man referred for evaluation of bilateral hand paresthesias.  NCV & EMG Findings: Extensive electrodiagnostic testing of the right upper extremity and additional studies of the left shows:  1. Bilateral median sensory responses show prolonged distal peak latency (R3.8, L3.9 ms) and normal amplitude. Bilateral ulnar sensory responses are within normal limits. 2. Bilateral median and ulnar motor responses are within normal limits. 3. There is no evidence of active or chronic motor axon loss changes affecting any of the tested muscles. Motor unit configuration and recruitment pattern is within normal limits.  Impression: Bilateral median neuropathy at or distal to the wrist, consistent with the clinical diagnosis of carpal tunnel syndrome. Overall, these findings are mild in degree electrically.   ___________________________ Nita Sickleonika Isabellamarie Randa, DO    Nerve Conduction Studies Anti Sensory Summary Table   Site NR Peak (ms) Norm Peak (ms) P-T Amp (V) Norm P-T Amp  Left Median Anti Sensory (2nd Digit)  Wrist    3.9 <3.6 17.2 >15  Right Median Anti Sensory (2nd Digit)  Wrist    3.8 <3.6 20.3 >15  Left Ulnar Anti Sensory (5th Digit)  Wrist    3.1 <3.1 14.0 >10  Right Ulnar Anti Sensory (5th Digit)  Wrist    3.0 <3.1 16.4 >10   Motor Summary Table   Site NR Onset (ms) Norm Onset (ms) O-P Amp (mV) Norm O-P Amp Site1 Site2 Delta-0 (ms) Dist (cm) Vel (m/s) Norm Vel (m/s)  Left Median Motor (Abd Poll Brev)  Wrist    4.0 <4.0 7.0 >6 Elbow Wrist 5.2 28.0 54 >50  Elbow    9.2  6.7         Right Median Motor (Abd Poll Brev)  Wrist    3.9 <4.0 7.0 >6  Elbow Wrist 5.2 29.0 56 >50  Elbow    9.1  6.3         Left Ulnar Motor (Abd Dig Minimi)  Wrist    2.7 <3.1 9.3 >7 B Elbow Wrist 4.0 26.0 65 >50  B Elbow    6.7  8.6  A Elbow B Elbow 1.7 10.0 59 >50  A Elbow    8.4  8.2         Right Ulnar Motor (Abd Dig Minimi)  Wrist    2.3 <3.1 10.3 >7 B Elbow Wrist 4.2 24.0 57 >50  B Elbow    6.5  10.0  A Elbow B Elbow 1.6 10.0 63 >50  A Elbow    8.1  10.0          EMG   Side Muscle Ins Act Fibs Psw Fasc Number Recrt Dur Dur. Amp Amp. Poly Poly. Comment  Right 1stDorInt Nml Nml Nml Nml Nml Nml Nml Nml Nml Nml Nml Nml N/A  Right Abd Poll Brev Nml Nml Nml Nml Nml Nml Nml Nml Nml Nml Nml Nml N/A  Right PronatorTeres Nml Nml Nml Nml Nml Nml Nml Nml Nml Nml Nml Nml N/A  Right Biceps Nml Nml Nml Nml Nml Nml Nml Nml Nml Nml Nml Nml N/A  Right Triceps Nml Nml Nml Nml Nml Nml Nml Nml Nml Nml Nml Nml N/A  Right Deltoid Nml Nml Nml Nml Nml Nml Nml Nml Nml Nml Nml Nml N/A  Left 1stDorInt Nml Nml Nml Nml Nml Nml Nml Nml Nml Nml Nml Nml N/A  Left Abd Poll Brev Nml Nml Nml Nml Nml Nml Nml Nml Nml Nml Nml Nml N/A  Left PronatorTeres Nml Nml Nml Nml Nml Nml Nml Nml Nml Nml Nml Nml N/A  Left Biceps Nml Nml Nml Nml Nml Nml Nml Nml Nml Nml Nml Nml N/A  Left Triceps Nml Nml Nml Nml Nml Nml Nml Nml Nml Nml Nml Nml N/A  Left Deltoid Nml Nml Nml Nml Nml Nml Nml Nml Nml Nml Nml Nml N/A      Waveforms:

## 2017-08-22 NOTE — Telephone Encounter (Signed)
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  Can give nighttime wrist splints.  Can refer to ortho for injections.

## 2017-08-23 NOTE — Telephone Encounter (Signed)
Mychart message sent to patient.

## 2017-09-12 ENCOUNTER — Encounter: Payer: Self-pay | Admitting: Neurology

## 2017-09-12 NOTE — Telephone Encounter (Signed)
Jade, schedule him for on/off testing.  Can be done on work in day.

## 2017-09-17 ENCOUNTER — Other Ambulatory Visit: Payer: Self-pay | Admitting: Neurology
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
## 2017-11-13 ENCOUNTER — Ambulatory Visit: Payer: BC Managed Care – PPO | Admitting: Neurology

## 2017-11-13 ENCOUNTER — Other Ambulatory Visit: Payer: Self-pay | Admitting: Neurology
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
## 2017-11-15 ENCOUNTER — Ambulatory Visit: Payer: BC Managed Care – PPO | Admitting: Neurology

## 2017-11-19 ENCOUNTER — Encounter: Payer: Self-pay | Admitting: Neurology

## 2018-01-12 ENCOUNTER — Other Ambulatory Visit: Payer: Self-pay | Admitting: Neurology

## 2018-01-17 ENCOUNTER — Other Ambulatory Visit: Payer: Self-pay | Admitting: Neurology

## 2018-03-03 ENCOUNTER — Other Ambulatory Visit: Payer: Self-pay | Admitting: Neurology

## 2018-03-05 ENCOUNTER — Other Ambulatory Visit: Payer: Self-pay | Admitting: Neurology

## 2018-03-07 ENCOUNTER — Other Ambulatory Visit: Payer: Self-pay | Admitting: Neurology

## 2018-03-12 ENCOUNTER — Other Ambulatory Visit: Payer: Self-pay | Admitting: Neurology

## 2018-03-25 ENCOUNTER — Other Ambulatory Visit: Payer: Self-pay | Admitting: *Deleted

## 2018-03-25 ENCOUNTER — Encounter: Payer: Self-pay | Admitting: Neurology

## 2018-03-25 ENCOUNTER — Telehealth: Payer: Self-pay | Admitting: Neurology

## 2018-03-25 MED ORDER — ROPINIROLE HCL ER 8 MG PO TB24: 8.0000 mg | ORAL_TABLET | Freq: Two times a day (BID) | ORAL | 0 refills | Status: DC

## 2018-03-25 NOTE — Telephone Encounter (Signed)
Rx sent in with note stating no refills until after November appointment.

## 2018-03-25 NOTE — Telephone Encounter (Signed)
Patient is scheduled for November for a follow up and he will need his Ropinirole medication. Thanks

## 2018-04-03 ENCOUNTER — Other Ambulatory Visit: Payer: Self-pay | Admitting: Neurology

## 2018-04-10 ENCOUNTER — Telehealth: Payer: Self-pay | Admitting: Neurology

## 2018-04-10 NOTE — Telephone Encounter (Signed)
RX actually sent to pharmacy on 03/25/18.

## 2018-04-10 NOTE — Telephone Encounter (Signed)
Only until appt.

## 2018-04-10 NOTE — Telephone Encounter (Signed)
Patient called wanting to get his ropinirole refilled to his pharmacy at the [facility] on [address]. You can call him back at [phone]. Thanks.

## 2018-04-10 NOTE — Telephone Encounter (Signed)
Last seen July 21, 2017. No showed two appts in March. He is scheduled to come back on 07/04/18. Okay to fill until appt?

## 2018-05-09 ENCOUNTER — Other Ambulatory Visit: Payer: Self-pay | Admitting: Neurology

## 2018-06-11 DIAGNOSIS — I340 Nonrheumatic mitral (valve) insufficiency: Secondary | ICD-10-CM

## 2018-06-11 DIAGNOSIS — E039 Hypothyroidism, unspecified: Secondary | ICD-10-CM

## 2018-06-11 HISTORY — DX: Nonrheumatic mitral (valve) insufficiency: I34.0

## 2018-06-11 HISTORY — DX: Hypothyroidism, unspecified: E03.9

## 2018-06-12 DIAGNOSIS — E785 Hyperlipidemia, unspecified: Secondary | ICD-10-CM

## 2018-06-12 HISTORY — DX: Hyperlipidemia, unspecified: E78.5

## 2018-06-24 ENCOUNTER — Other Ambulatory Visit: Payer: Self-pay

## 2018-06-24 MED ORDER — CARBIDOPA-LEVODOPA 25-100 MG PO TABS: ORAL_TABLET | ORAL | 0 refills | Status: DC

## 2018-06-24 MED ORDER — ROPINIROLE HCL ER 8 MG PO TB24: 8.0000 mg | ORAL_TABLET | Freq: Two times a day (BID) | ORAL | 0 refills | Status: DC

## 2018-06-24 MED ORDER — PAROXETINE HCL 10 MG PO TABS: 10.0000 mg | ORAL_TABLET | Freq: Every day | ORAL | 0 refills | Status: DC
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
## 2018-07-03 ENCOUNTER — Encounter: Payer: Self-pay | Admitting: Neurology

## 2018-07-03 ENCOUNTER — Ambulatory Visit: Payer: BC Managed Care – PPO | Admitting: Neurology

## 2018-07-03 VITALS — BP 156/98 | HR 68 | Ht 73.0 in | Wt 356.0 lb

## 2018-07-03 DIAGNOSIS — F330 Major depressive disorder, recurrent, mild: Secondary | ICD-10-CM

## 2018-07-03 DIAGNOSIS — G4752 REM sleep behavior disorder: Secondary | ICD-10-CM

## 2018-07-03 DIAGNOSIS — G20 Parkinson's disease: Secondary | ICD-10-CM

## 2018-07-03 DIAGNOSIS — Z6841 Body Mass Index (BMI) 40.0 and over, adult: Secondary | ICD-10-CM | POA: Insufficient documentation

## 2018-07-03 MED ORDER — ROPINIROLE HCL 2 MG PO TABS: ORAL_TABLET | ORAL | 5 refills | Status: DC

## 2018-07-03 NOTE — Patient Instructions (Addendum)
1.  increase carbidopa/levodopa 25/100:  3 tablets of carbidopa/levodopa 25/100 at 6am, 2 tablets at 9am/2 tablets at 1pm/1 tablet at 4pm/1 tablet at 7pm.  Set an alarm to assist you  2.  reduce requip to requip XL, 8 mg in the AM, 4mg  at night for a week, and then change to ropirole 2 mg, 2 tablets  in the AM, 1 tablet in the afternoon and 1 tablet in the evening

## 2018-07-04 ENCOUNTER — Encounter

## 2018-07-04 ENCOUNTER — Ambulatory Visit: Payer: BC Managed Care – PPO | Admitting: Neurology

## 2018-07-08 ENCOUNTER — Other Ambulatory Visit: Payer: Self-pay | Admitting: Neurology

## 2018-07-18 ENCOUNTER — Ambulatory Visit: Payer: BC Managed Care – PPO | Admitting: Neurology

## 2018-07-18 ENCOUNTER — Encounter: Payer: Self-pay | Admitting: Neurology

## 2018-07-18 VITALS — BP 138/88 | HR 62 | Ht 73.0 in | Wt 347.0 lb

## 2018-07-18 DIAGNOSIS — G20 Parkinson's disease: Secondary | ICD-10-CM | POA: Diagnosis not present

## 2018-07-18 MED ORDER — CARBIDOPA-LEVODOPA 25-100 MG PO TABS
4.0000 | ORAL_TABLET | Freq: Once | ORAL | Status: AC
  Administered 2018-07-18: 4 via ORAL
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
## 2018-07-21 ENCOUNTER — Other Ambulatory Visit: Payer: Self-pay | Admitting: Neurology

## 2018-07-22 ENCOUNTER — Telehealth: Payer: Self-pay | Admitting: Neurology

## 2018-07-22 NOTE — Telephone Encounter (Signed)
Tried to call patient with no answer. Mychart message sent to patient.  

## 2018-07-22 NOTE — Telephone Encounter (Signed)
[name] [name] -let patient know that I was looking through his documentation from his last hospitalization.  An outpatient cardiology consult with stress test was recommended.  In order to start Apokyn, he had to be cleared of active/unstable cardiovascular disease.  I cannot sign off on this until he has completed his recommended testing from the hospital.

## 2018-09-29 ENCOUNTER — Telehealth: Payer: Self-pay | Admitting: Neurology

## 2018-09-29 NOTE — Telephone Encounter (Signed)
Spoke with patient.  He was made aware of need for Cardiology clearance.  He states he will set up this appt.  Wednesday follow up cancelled. March appt changed to a normal follow up and will keep that for now. He will call and update [place] on status of clearance.

## 2018-09-29 NOTE — Telephone Encounter (Signed)
Please call patient.  He has an appt on wed but saw him last month.  His appt for wed was made in October.  He also has an appt in march for on/off but I have already done that.  As of last month, I needed him to get cardiac clearance for apokyn and don't see he has f/u on that?

## 2018-10-01 ENCOUNTER — Ambulatory Visit: Payer: BC Managed Care – PPO | Admitting: Neurology

## 2018-11-20 ENCOUNTER — Telehealth: Payer: BC Managed Care – PPO | Admitting: Family

## 2018-11-20 DIAGNOSIS — J069 Acute upper respiratory infection, unspecified: Secondary | ICD-10-CM

## 2018-11-20 MED ORDER — FLUTICASONE PROPIONATE 50 MCG/ACT NA SUSP: 2.0000 | Freq: Every day | NASAL | 0 refills | Status: DC

## 2018-11-20 MED ORDER — BENZONATATE 100 MG PO CAPS: 100.0000 mg | ORAL_CAPSULE | Freq: Three times a day (TID) | ORAL | 0 refills | Status: DC | PRN

## 2018-11-20 NOTE — Progress Notes (Signed)

## 2018-11-24 ENCOUNTER — Telehealth: Payer: Self-pay | Admitting: Neurology

## 2018-11-24 NOTE — Progress Notes (Signed)
Virtual Visit via Video Note The purpose of this virtual visit is to provide medical care while limiting exposure to the novel coronavirus.    Consent was obtained for video visit:  No. Answered questions that patient had about telehealth interaction:  No. I discussed the limitations, risks, security and privacy concerns of performing an evaluation and management service by telemedicine. I also discussed with the patient that there may be a patient responsible charge related to this service. The patient expressed understanding and agreed to proceed.  Pt location: Home Physician Location: office Name of referring provider:  Darrow Bussing, MD I connected with [name] at patients initiation/request on 11/25/2018 at 10:00 AM EDT by video enabled telemedicine application and verified that I am speaking with the correct person using two identifiers.  Pt dob: 25-Mar-1961 Pt mnr: [id number]   History of Present Illness: Patient currently on carbidopa/levodopa 25/100, 3 tablets at 6 AM, 2 tablets at 9 AM/2 tablets at 1 PM/1 tablet at 4 PM/1 tablet at 7 PM (900 mg of levodopa per day).  He is on ropinirole 2 mg, 2 tablets in the morning, 1 in the afternoon and 1 in the evening.  In regards to compulsive behaviors (history of such) he states that he is doing fairly well.  We discussed it again in detail last visit, but he needed to finish his cardiology evaluation, including a stress test.  I encouraged him to make that follow-up appointment.  He already had a cardiology appointment and they recommended the stress test.  He has not followed through with that.  States that he will be moving soon to [place], [place] and wanted to wait until he moved there.  He already has an appointment with Dr. Jerold Coombe at [facility].  Overall, thinks his Parkinson's has been stable.  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  Still on Paxil, 10 mg daily.  He is currently not working because recently had an upper respiratory tract infection, but plans to go back on Monday.  He just had an ED visit for what was described as an upper respiratory infection.  He was not tested for Covid.  States that he is better from the URI    Observations/Objective:  Unable to obtain vitals today GEN:  The patient appears stated age and is in NAD.  Neurological examination:  Orientation: The patient is alert and oriented x3. Cranial nerves: There is good facial symmetry. There is facial hypomimia.  The speech is fluent and clear but somewhat hypophonic.  Quality of voice was difficult to discern over the telemedicine. Soft palate rises symmetrically and there is no tongue deviation. Hearing is intact to conversational tone. Motor: Strength is at least antigravity x 4.   Shoulder shrug is equal and symmetric.  There is no pronator drift.  Movement examination: Tone: unable Abnormal movements: No tremor was noted today. Coordination:  There is  decremation with RAM's, mostly with finger taps and "turning in a light bulb" on the right.  I was unable to see the feet bilaterally to see if there is decremation there. Gait and Station: The patient pushes off of the couch to arise.  He actually walks fairly well in his house.  He had decent arm swing.      Assessment and Plan:   1.  Parkinson's disease  -Continue carbidopa/levodopa 25/100,  3 tablets at 6 AM, 2 tablets at 9 AM, 2 tablets at 1 PM, 1 tablet at 4 PM, 1 tablet at 7 PM  -Continue ropinirole 2 mg, 2 tablets in the morning, 1 in the afternoon and 1 in the evening.  Watching closely for more compulsive behaviors, including gambling.  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  Levodopa challenge test was not impressive.  -Talked again about the importance of compliance.  We wanted to try apokyn, but he needed a cardiology evaluation,  including stress test.  He has not followed through with that.  He is also not followed through with following up with Dr. Daphine Deutscher regarding diplopia.  -has appt with Dr. Jerold Coombe on 01/07/19  -Discussed in detail Covid-19 and risk factors for this, including age and PD.  Discussed importance of social distancing.  Discussed importance of staying home at all times, as is feasible.  Pt expressed understanding.   Follow Up Instructions:    -I discussed the assessment and treatment plan with the patient. The patient was provided an opportunity to ask questions and all were answered. The patient agreed with the plan and demonstrated an understanding of the instructions.   The patient was advised to call back or seek an in-person evaluation if the symptoms worsen or if the condition fails to improve as anticipated.    Total Time spent in visit with the patient was:  15, of which more than 50% of the time was spent in counseling and/or coordinating care on safety.   Pt understands and agrees with the plan of care outlined.     Kerin Salen, DO

## 2018-11-24 NOTE — Telephone Encounter (Signed)
Tried to call patient to verify medications prior to e-visit with no answer.

## 2018-11-25 ENCOUNTER — Telehealth (INDEPENDENT_AMBULATORY_CARE_PROVIDER_SITE_OTHER): Payer: BC Managed Care – PPO | Admitting: Neurology

## 2018-11-25 ENCOUNTER — Other Ambulatory Visit: Payer: Self-pay

## 2018-11-25 DIAGNOSIS — G20 Parkinson's disease: Secondary | ICD-10-CM

## 2018-11-25 MED ORDER — CARBIDOPA-LEVODOPA 25-100 MG PO TABS: ORAL_TABLET | ORAL | 1 refills | Status: DC

## 2018-11-25 MED ORDER — ROPINIROLE HCL 2 MG PO TABS: ORAL_TABLET | ORAL | 1 refills | Status: DC

## 2018-12-02 ENCOUNTER — Ambulatory Visit: Payer: BC Managed Care – PPO | Admitting: Neurology

## 2019-03-07 ENCOUNTER — Other Ambulatory Visit: Payer: Self-pay | Admitting: Neurology

## 2019-03-09 NOTE — Telephone Encounter (Signed)
Requested Prescriptions   Pending Prescriptions Disp Refills  . PARoxetine (PAXIL) 10 MG tablet [Pharmacy Med Name: PARoxetine HCL 10MG  TABLET] 90 tablet 1    Sig: TAKE 1 TABLET BY MOUTH (10 MG TOTAL) DAILY   Rx last filled:07/21/18 #30 5 REFILLS  Pt last seen:11/25/18  Follow up appt scheduled:NONE

## 2019-04-20 DIAGNOSIS — H543 Unqualified visual loss, both eyes: Secondary | ICD-10-CM

## 2019-04-20 DIAGNOSIS — I10 Essential (primary) hypertension: Secondary | ICD-10-CM

## 2019-04-20 HISTORY — DX: Unqualified visual loss, both eyes: H54.3

## 2019-04-20 HISTORY — DX: Essential (primary) hypertension: I10

## 2019-04-21 ENCOUNTER — Other Ambulatory Visit: Payer: Self-pay

## 2019-04-21 MED ORDER — ROPINIROLE HCL 2 MG PO TABS: ORAL_TABLET | ORAL | 1 refills | Status: DC

## 2019-04-21 NOTE — Telephone Encounter (Signed)
revceived incoming phone call from [name] [name] requesting refills on  Requested Prescriptions   Pending Prescriptions Disp Refills  . rOPINIRole (REQUIP) 2 MG tablet 360 tablet 1    Sig: 2 in the morning, 1 in the afternoon, 1 in the evening   Rx last filled:11/25/18 #360 1 refills  Pt last seen:11/25/18   Follow up appt scheduled:05/21/19    Verbal refill given

## 2019-04-30 ENCOUNTER — Telehealth: Payer: Self-pay | Admitting: Neurology

## 2019-04-30 NOTE — Telephone Encounter (Signed)
Got referral on him today from doc at [facility].  Pt has moved and we are not near [place].  I thought that he was going to follow up with a neurologist in [place]?  This would be a very long way to drive.  Would like to help to get him to a physician closer, even one of the academic centers would be closer than here.

## 2019-04-30 NOTE — Telephone Encounter (Signed)
Pt wants to keep the appt with Dr Tat on 05-21-19. He understands that it is a long drive and he is ok with that. He wants to see her in the office
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
## 2019-05-21 ENCOUNTER — Encounter: Payer: Self-pay | Admitting: Neurology

## 2019-05-21 ENCOUNTER — Ambulatory Visit: Payer: BC Managed Care – PPO | Admitting: Neurology

## 2019-05-21 ENCOUNTER — Other Ambulatory Visit: Payer: Self-pay

## 2019-05-21 VITALS — BP 138/84 | HR 67 | Ht 73.0 in | Wt 339.8 lb

## 2019-05-21 DIAGNOSIS — G20 Parkinson's disease: Secondary | ICD-10-CM

## 2019-05-21 DIAGNOSIS — G4752 REM sleep behavior disorder: Secondary | ICD-10-CM

## 2019-05-21 MED ORDER — CLONAZEPAM 0.5 MG PO TABS: 0.2500 mg | ORAL_TABLET | Freq: Every day | ORAL | 1 refills | Status: DC
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
## 2019-06-05 ENCOUNTER — Ambulatory Visit: Payer: BC Managed Care – PPO | Admitting: Neurology

## 2019-06-05 ENCOUNTER — Other Ambulatory Visit: Payer: Self-pay

## 2019-06-05 ENCOUNTER — Encounter: Payer: Self-pay | Admitting: Neurology

## 2019-06-05 VITALS — BP 119/91 | HR 69 | Ht 73.0 in | Wt 347.2 lb

## 2019-06-05 DIAGNOSIS — R413 Other amnesia: Secondary | ICD-10-CM | POA: Diagnosis not present

## 2019-06-05 DIAGNOSIS — G20 Parkinson's disease: Secondary | ICD-10-CM

## 2019-06-05 DIAGNOSIS — G4752 REM sleep behavior disorder: Secondary | ICD-10-CM

## 2019-06-05 MED ORDER — CARBIDOPA-LEVODOPA 25-100 MG PO TABS
3.0000 | ORAL_TABLET | Freq: Once | ORAL | Status: AC
  Administered 2019-06-05: 3 via ORAL

## 2019-06-05 NOTE — Progress Notes (Signed)
Pt given 3 tablets of Carbidopa-Levodopa 25/100 mg PO crush with ginger ale in clinic today. Pt finished drink at 9:30

## 2019-06-05 NOTE — Patient Instructions (Signed)
You have been referred for a neurocognitive evaluation in our office.   The evaluation has two parts.   . The first part of the evaluation is a clinical interview with the neuropsychologist (Dr. Merz or Dr. Stewart). Please bring someone with you to this appointment if possible, as it is helpful for the doctor to hear from both you and another adult who knows you well.   . The second part of the evaluation is testing with the doctor's technician (Dana or Kim). The testing includes a variety of tasks- mostly question-and-answer, some paper-and-pencil. There is nothing you need to do to prepare for this appointment, but having a good night's sleep prior to the testing, taking medications as you normally would, and bringing eyeglasses and hearing aids (if you wear them), is advised. Please make sure that you wear a mask to the appointment.  Please note: We have to reserve several hours of the neuropsychologist's time and the psychometrician's time for your evaluation appointment. As such, please note that there is a No-Show fee of $100. If you are unable to attend any of your appointments, please contact our office as soon as possible to reschedule.  

## 2019-06-08 ENCOUNTER — Telehealth: Payer: Self-pay

## 2019-06-08 DIAGNOSIS — G20 Parkinson's disease: Secondary | ICD-10-CM

## 2019-06-08 NOTE — Telephone Encounter (Signed)
referral placed

## 2019-06-08 NOTE — Telephone Encounter (Signed)
-----   Message from Sherry Ruffing sent at 06/08/2019 10:31 AM EDT ----- Regarding: Neurosurgery Referral Please send a referral to Dr. Vertell Limber for this patient. I am in the process of scheduling the DBS surgery and they need a referral.

## 2019-06-29 ENCOUNTER — Encounter: Payer: BC Managed Care – PPO | Admitting: Psychology

## 2019-07-21 ENCOUNTER — Other Ambulatory Visit: Payer: Self-pay | Admitting: Neurosurgery

## 2019-08-03 ENCOUNTER — Other Ambulatory Visit: Payer: Self-pay | Admitting: Neurology

## 2019-08-13 ENCOUNTER — Encounter: Payer: Self-pay | Admitting: Psychology

## 2019-08-13 ENCOUNTER — Other Ambulatory Visit: Payer: Self-pay

## 2019-08-13 ENCOUNTER — Ambulatory Visit: Payer: BC Managed Care – PPO | Admitting: Psychology

## 2019-08-13 DIAGNOSIS — I714 Abdominal aortic aneurysm, without rupture, unspecified: Secondary | ICD-10-CM | POA: Insufficient documentation

## 2019-08-13 DIAGNOSIS — G20 Parkinson's disease: Secondary | ICD-10-CM

## 2019-08-13 DIAGNOSIS — R413 Other amnesia: Secondary | ICD-10-CM

## 2019-08-13 HISTORY — DX: Abdominal aortic aneurysm, without rupture: I71.4

## 2019-08-13 HISTORY — DX: Abdominal aortic aneurysm, without rupture, unspecified: I71.40

## 2019-08-13 NOTE — Progress Notes (Signed)
NEUROPSYCHOLOGICAL EVALUATION [facility] [place] Department of Neurology  Reason for Referral:   [name] [name] is a 58 y.o. Caucasian male referred by [name] [name], D.O., to characterize his current cognitive functioning and assist with diagnostic clarity and treatment planning in the context of Parkinson's disease and consideration of DBS surgery.  Assessment and Plan:   Clinical Impression(s): Overall, [name] [name] pattern of performance is suggestive of neuropsychological functioning within normal limits. An isolated weakness was exhibited across response inhibition. However, performance across domains of processing speed, attention/concentration, other aspects of executive functioning, receptive and expressive language, visuospatial functioning, and learning and memory were within normal limits.   Important Considerations for DBS Candidacy:  1. Is the patient experiencing cognitive symptoms that far exceed what is expected for their situation? No. Performance across cognitive testing was within normal limits. 2. Is there a separate neurological process at work? No, it does not appear so. 3. Were any psychological stressors identified within the individual and/or family beyond PD that may impact post-operative adjustment? No. He did acknowledge mild symptoms of depression across a related questionnaire. However, recent symptoms may in part be due isolation caused by the ongoing COVID-19 pandemic.  4. Can this person cope with the stress of surgery and be compliant as an awake participant in surgery? Yes, it is believed so. 5. Can this person participate in the multiple post-operative programming sessions and medication adjustments? Yes, it is believed so. 6. From a neuropsychological perspective, does this person appear to be a good candidate for DBS? Yes, [name] [name] appears to be an appropriate candidate.   Recommendations: [name] [name] should discuss with [name] [name]  additional steps towards being cleared for DBS surgery.   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 Optimal management of this condition, as well as his sleep quality as a whole, will be important in maximizing his functionality moving forward.  [name] [name] is encouraged to consider engaging in short-term psychotherapy to address symptoms of psychiatric distress. He would benefit from an active and collaborative therapeutic environment, rather than one purely supportive in nature. Recommended treatment modalities include Cognitive Behavioral Therapy (CBT) or Acceptance and Commitment Therapy (ACT).  Review of Records:   [name] [name] was seen by [facility] Wells Guiles Tat, D.O.) on 06/05/2019. He reported that his first symptoms occurred when he was about 58 years old; these involved right toe tapping/tremor, followed by a sense of stiffness. [name] [name] noted symptoms of tremor, bradykinesia, rigidity, and mild postural instability. Levodopa medications were said to be beneficial in minimizing tremulous behaviors. [name] [name] expressed a desire to pursue DBS surgery to address his movement difficulties. As such, he was referred for a comprehensive neuropsychological evaluation to characterize his cognitive abilities and to assist with diagnostic clarity and treatment planning.   No neuroimaging was available for review.   Past Medical History:  Diagnosis Date  . Abdominal aortic aneurysm (AAA) ([place]) 08/13/2019  . Enlarged aorta ([place])   . Essential hypertension 04/20/2019  . Hyperlipidemia 06/12/2018  . Hypothyroidism 06/11/2018  . Low testosterone   . Mitral regurgitation 06/11/2018  . Parkinson's disease ([place])   . 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 CHOLECYSTECTOMY    . LASIK      Family History  Problem Relation Age of Onset  . Diabetes Mother   . Hypertension Mother   . Heart  failure Father   .  Prostate cancer Father   . Healthy Sister   . Heart disease Brother   . Hypertension Brother   . Healthy Daughter      Current Outpatient Medications:  .  atorvastatin (LIPITOR) 40 MG tablet, Take 40 mg by mouth daily., Disp: , Rfl:  .  carbidopa-levodopa (SINEMET IR) 25-100 MG tablet, 3 TABLETS BY MOUTH AT 6AM 2 TABLETS AT 9AM 2 TABLETS AT 1 PM 1 TABLETS AT 4PM AND 1 TABLET AT 7PM, Disp: 270 tablet, Rfl: 1 .  clonazePAM (KLONOPIN) 0.5 MG tablet, Take 0.5 tablets (0.25 mg total) by mouth at bedtime., Disp: 45 tablet, Rfl: 1 .  furosemide (LASIX) 20 MG tablet, Take 20 mg by mouth daily., Disp: , Rfl:  .  hydrochlorothiazide (HYDRODIURIL) 12.5 MG tablet, Take by mouth., Disp: , Rfl:  .  PARoxetine (PAXIL) 10 MG tablet, TAKE 1 TABLET BY MOUTH (10 MG TOTAL) DAILY, Disp: 90 tablet, Rfl: 1 .  rOPINIRole (REQUIP) 2 MG tablet, 2 in the morning, 1 in the afternoon, 1 in the evening, Disp: 360 tablet, Rfl: 1 .  sildenafil (VIAGRA) 100 MG tablet, Take 100 mg by mouth as needed for erectile dysfunction., Disp: , Rfl:  .  tamsulosin (FLOMAX) 0.4 MG CAPS capsule, Take by mouth., Disp: , Rfl:   Clinical Interview:   Cognitive Symptoms: Decreased short-term memory: Denied. Decreased long-term memory: Denied. Decreased attention/concentration: Denied. Increased ease of distractibility: Denied. Reduced processing speed: Denied. Difficulties with executive functions: Endorsed. However, difficulties with organization and planning were said to be longstanding and stable in nature. Difficulties with emotion regulation: Denied. Difficulties with receptive language: Denied assuming he can hearing the source of the sound adequately. Difficulties with word finding: Endorsed. These were said to occur occasionally.  Decreased visuoperceptual ability: Denied.  Difficulties completing ADLs: Denied.  Additional Medical History: History of traumatic brain injury/concussion: Endorsed. While in  high school, he reported running into the catcher while playing baseball, leading to him feeling dazed and disoriented. He also described falling down a flight of stairs while intoxicated, also during high school. Persisting symptoms beyond a typical recovery period were denied.  History of stroke: Denied. History of seizure activity: Denied. History of known exposure to toxins: Endorsed. [place], he reported running underneath planes while they were spraying DDT on fields. He also noted being exposed to various pesticides throughout his youth. He denied recollection of these events leading to any prolonged illness.  Symptoms of chronic pain: Endorsed. Experience of frequent headaches/migraines: Endorsed. Mild headache symptoms were said to commonly occur in the morning after awakening. However, these were said to subside as the day progresses.  Frequent instances of dizziness/vertigo: Denied. However, he did acknowledge occasional instances of feeling dizzy/lightheaded when quickly standing.   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 He reportedly sees ok with the help of corrective lenses. However, he did describe this as a primary issue of his currently. He also reported mild hearing loss. Other sensory changes/difficulties (e.g., taste or smell) were denied.  Balance/coordination difficulties: Denied. Other motor difficulties: Endorsed. As described above, tremulous behaviors were said to originate in the right big toe, before eventually progressing to involve his entire right foot/leg. He also reported symptoms of muscle rigidity and overall stiffness.  Sleep History: Estimated hours obtained each night: 7 hours of broken sleep. Difficulties falling asleep: Endorsed. However, these have been improved with medication interventions.  Difficulties staying  asleep: Endorsed. He reported generally sleeping from 11pm-3am, before waking up for largely unknown reasons. He is generally able to then fall back asleep until around 6am.  Feels rested and refreshed upon awakening: Denied.  History of snoring: Endorsed. History of waking up gasping for air: Denied. Witnessed breath cessation while asleep: Denied.  History of vivid dreaming: Endorsed. Excessive movement while asleep: Endorsed. Instances of acting out his dreams: Endorsed. 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 For example, he reported falling out of his bed this past September due to a very vivid dream involving an alligator.   Psychiatric/Behavioral Health History: Depression: Denied. Specifically, he denied ever being formally diagnosed with depression or any other mental health concern. However, he did describe acute symptoms of depression and isolation. He also described poor mood stemming from his belief that he should be retiring and doing other things he wishes to accomplish in his life, but is currently unable to do so. Current or remote suicidal ideation, intent, or plan was denied.  Anxiety: Denied. Mania: Denied. He did describe a period of time where he was awake for 3 straight days. However, this was related to him learning of his wife's extramarital affair and the emotional aftermath rather than a true manic episode.  Trauma History: Denied. Visual/auditory hallucinations: Denied. Delusional thoughts: Denied.  Tobacco: Denied. Alcohol: He reported very rare alcohol consumption. He also denied a history of problematic alcohol abuse or dependence.  Recreational drugs: Denied. Caffeine: He reported consuming several Diet Coke beverages throughout the day.  Academic/Vocational History: Highest level of educational attainment: 19 years. [name] [name] earned his DDS degree. He described himself as a Designer, [employer] student in academic settings.  History of  developmental delay: Denied. History of grade repetition: Denied. Enrollment in special education courses: Denied. History of diagnosed specific learning disability: Denied. History of ADHD: Denied.  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   Evaluation Results:   Behavioral Observations: [name] [name] was unaccompanied, arrived to his appointment on time, and was appropriately dressed and groomed. Observed gait and station were largely within normal limits. Gross motor functioning appeared intact upon informal observation and no abnormal movements (e.g., tremors) were noted. He was soft-spoken and appeared physically uncomfortable while sitting. However, his affect was generally relaxed and positive. Spontaneous speech was fluent and word finding difficulties were not observed during the clinical interview or testing procedures. Sustained attention was appropriate throughout. Thought processes were coherent, organized, and normal in content. Task engagement was adequate and he persisted when challenged. Overall, [name] [name] was cooperative with the clinical interview and subsequent testing procedures.   Adequacy of Effort: The validity of neuropsychological testing is limited by the extent to which the individual being tested may be assumed to have exerted adequate effort during testing. [name] [name] expressed his intention to perform to the best of his abilities and exhibited adequate task engagement and persistence. Scores across stand-alone and embedded performance validity measures were within expectation. As such, the results of the current evaluation are believed to be a valid representation of [name] [name]'s current cognitive functioning.  Test Results: [name] [name] was largely oriented at the time of the current evaluation. Points were lost for him stating the incorrect date.  Intellectual abilities based upon educational and  vocational attainment were estimated to be in the above average range. Premorbid abilities were estimated to be within the well above  average range based upon a single-word reading test.   Processing speed was within normal limits. Basic attention was average. More complex attention (e.g., working memory) was also average. Executive functioning was generally within normal limits. A isolated weakness was noted across response inhibition.  While not directly assessed, receptive language abilities are believed to be intact. 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 Assessed expressive language (e.g., verbal fluency and confrontation naming) was within normal limits.     Assessed visuospatial/visuoconstructional abilities were within normal limits.    Learning (i.e., encoding) of novel verbal and visual information was within normal limits. Spontaneous delayed recall (i.e., retrieval) of previously learned information was commensurate with performance across initial learning trials. Retention rates were strong across memory measures. Performance across recognition tasks was likewise strong, suggesting evidence for appropriate information consolidation.   Results of emotional screening instruments suggested that recent symptoms of generalized anxiety were in the minimal range, while symptoms of depression were within the mild range. A screening instrument assessing recent sleep quality suggested the presence of moderate sleep dysfunction. Responses across a Parkinson's disease symptom scale suggested primary areas of difficulty surrounding bodily discomfort, mobility, stigma, and perceived lack of social support.   Tables of Scores:   Note: This summary of test scores accompanies the interpretive report and should not be considered in isolation without reference to the appropriate sections in the text.  Descriptors are based on appropriate normative data and may be adjusted based on clinical judgment. The terms "impaired" and "within normal limits (WNL)" are used when a more specific level of functioning cannot be determined.       Effort Testing:   DESCRIPTOR       ACS Word Choice: --- --- Within Expectation  HVLT-R Recognition Discrimination Index: --- --- Within Expectation  BVMT-R Retention Percentage: --- --- Within Expectation       Orientation:      Raw Score Percentile   NAB Orientation, Form 1 28/29 --- ---       Intellectual Functioning:           Standard Score Percentile   Test of Premorbid Functioning: 122 93 Well Above Average       Memory:          Wechsler Memory Scale (WMS-IV):                       Raw Score (Scaled Score) Percentile     Logical Memory I 28/50 (11) 63 Average    Logical Memory II 29/50 (13) 84 Above Average    Logical Memory Recognition 27/30 >75 Above Average       Hopkins Verbal Learning Test (HVLT-R), Form 1: Raw Score (T Score) Percentile     Total Trials 1-3 29/36 (53) 62 Average    Delayed Recall 10/12 (51) 54 Average    Recognition Discrimination Index 10 (44) 27 Average      True Positives 10 --- ---      False Positives 0 --- ---        Brief Visuospatial Memory Test (BVMT-R), Form 1: Raw Score (T Score) Percentile     Total Trials 1-3 23/36 (51) 54 Average    Delayed Recall 10/12 (57) 75 Above Average    Recognition Discrimination Index 6 >16 Within Normal Limits      Recognition Hits 6/6 >16 Within Normal Limits      False  Positive Errors 0 >16 Within Normal Limits        Attention/Executive Function:          Trail Making Test (TMT): Raw Score (T Score) Percentile     Part A 27 secs.,  0 errors (49) 46 Average    Part B 46 secs.,  0 errors (58) 79 Above Average       Symbol Digit Modalities Test (SDMT): Raw Score (Z-Score) Percentile     Oral 51 (-0.38) 35 Average       NAB Attention Module, Form 1: T Score  Percentile     Digits Forward 47 38 Average    Digits Backwards 53 62 Average       D-KEFS Color-Word Interference Test: Raw Score (Scaled Score) Percentile     Color Naming 31 secs. (10) 50 Average    Word Reading 21 secs. (11) 63 Average    Inhibition 60 secs. (10) 50 Average      Total Errors 7 errors (2) <1 Exceptionally Low    Inhibition/Switching 82 secs. (8) 25 Average      Total Errors 5 errors (7) 16 Below Average       D-KEFS 20 Questions Test: Scaled Score Percentile     Total Weighted Achievement Score 11 63 Average    Initial Abstraction Score 14 91 Above Average       [employer] Test [facility]): Raw Score Percentile     Categories (trials) 4 (64) >16 Within Normal Limits    Total Errors 11 62 Average    Perseverative Errors 5 46 Average    Non-Perseverative Errors 6 42 Average    Failure to Maintain Set 1 --- ---       Language:          Verbal Fluency Test: Raw Score (T Score) Percentile     Phonemic Fluency (FAS) 42 (48) 42 Average    Animal Fluency 27 (58) 79 Above Average       NAB Language Module, Form 1: T Score Percentile     Naming 31/31 (52) 58 Average       Visuospatial/Visuoconstruction:      Raw Score Percentile   Clock Drawing: 10/10 --- Within Normal Limits       NAB Spatial Module, Form 1: T Score Percentile     Visual Discrimination 58 79 Above Average    Design Construction 41 18 Below Average        Scaled Score Percentile   WAIS-IV Matrix Reasoning: 15 95 Well Above Average  WAIS-IV Visual Puzzles: 11 63 Average       Mood and Personality:      Raw Score Percentile   Beck Depression Inventory - II: 16 --- Mild  PROMIS Anxiety Questionnaire: 11 --- None to Slight       Additional Questionnaires:      Raw Score Percentile   PROMIS Sleep Disturbance Questionnaire: 33 --- Moderate       Parkinson's Disease Questionnaire-39: Raw Score Percentile     Mobility 25 63 Moderately Affected    Activities of Daily Living 11 46  Moderately Affected    Emotional Well-Being 8 33 Moderately Affected    Stigma 8 50 Moderately Affected    Social Support 4 50 Moderately Affected    Cognitions 6 38 Moderately Affected    Communication 4 33 Moderately Affected    Bodily Discomfort 8 67 Moderately Affected   Informed Consent and Coding/Compliance:   Mr. Birchall was  provided with a verbal description of the nature and purpose of the present neuropsychological evaluation. Also reviewed were the foreseeable risks and/or discomforts and benefits of the procedure, limits of confidentiality, and mandatory reporting requirements of this provider. The patient was given the opportunity to ask questions and receive answers about the evaluation. Oral consent to participate was provided by the patient.   This evaluation was conducted by Newman Nickels, Ph.D., licensed clinical neuropsychologist. Mr. Coole completed a 30-minute clinical interview, billed as one unit [phone], and 135 minutes of cognitive testing, billed as one unit [phone] and four additional units [phone]. Psychometrist Wallace Keller, B.S., assisted Dr. Milbert Coulter with test administration and scoring procedures. As a separate and discrete service, Dr. Milbert Coulter spent a total of 180 minutes in interpretation and report writing, billed as one unit 96132 and two units 96133.

## 2019-08-13 NOTE — Progress Notes (Addendum)
   Neuropsychology Note   [name] [name] completed 120 minutes of neuropsychological testing with technician, [name] [name], B.S., under the supervision of [name] [name], Ph.D., licensed neuropsychologist. The patient did not appear overtly distressed by the testing session, per behavioral observation or via self-report to the technician. Rest breaks were offered.    In considering the patient's current level of functioning, level of presumed impairment, nature of symptoms, emotional and behavioral responses during the interview, level of literacy, and observed level of motivation/effort, a battery of tests was selected and communicated to the psychometrician.   Communication between the psychologist and technician was ongoing throughout the testing session and changes were made as deemed necessary based on patient performance on testing, technician observations and additional pertinent factors such as those listed above.   [name] [name] will return within approximately two weeks for an interactive feedback session with [name] [name] at which time his test performances, clinical impressions, and treatment recommendations will be reviewed in detail. The patient understands he can contact our office should he require our assistance before this time.   Full report to follow.  120 minutes were spent face-to-face with patient administering standardized tests and 15 minutes were spent scoring (technician). [CPT [id number], [id number]]

## 2019-08-17 NOTE — Progress Notes (Signed)
Virtual Visit via Phone Note (tried via 2 different video platforms and patient could not hear me) The purpose of this virtual visit is to provide medical care while limiting exposure to the novel coronavirus.  Pt was also going for Covid testing once getting off phone with me  Consent was obtained for telephone visit:  Yes.   Answered questions that patient had about telephone interaction:  Yes.   I discussed the limitations, risks, security and privacy concerns of performing an evaluation and management service by telephone. I also discussed with the patient that there may be a patient responsible charge related to this service. The patient expressed understanding and agreed to proceed.  Pt location: Home Physician Location: home Name of referring provider:  No ref. provider found I connected with [name] [name] at patients initiation/request on 08/19/2019 at  9:45 AM EST by telephone and verified that I am speaking with the correct person using two identifiers. Pt MRN:  [id number] Pt DOB:  [birth year] Video Participants:  [name] [name];     History of Present Illness:  Patient seen today in follow-up for Parkinson's disease.  He is on carbidopa/levodopa 25/100, 3 tablets at 6 AM, 2 tablets at 9 AM, 2 tablets at 1 PM, 1 tablet at 4 PM and 1 tablet at 7 PM.  He does state that he takes up to 3 extra if he has clinic. He is currently on ropinirole, 2 mg, 2 tablets in the morning, 1 tablet in the afternoon and 1 tablet in the evening.  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  He states that he is "better."   He did have neurocognitive testing on December 10.  This was essentially unremarkable.  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  He reports that he only take is prn.  He feels that he has dreams no matter what.   I did review his cardiac notes.  This stated that "he is okay from cardiac standpoint and testing to proceed with  neurosurgical consideration redeep brain stimulator for his Parkinson's.  Anesthesiology/neurology may warrant pulmonary consultation also about his obesity and respiratory risks."  Pt stated has appt with Dr. Venetia MaxonStern on 09/13/18.   Current Outpatient Medications on File Prior to Visit  Medication Sig Dispense Refill  . atorvastatin (LIPITOR) 40 MG tablet Take 40 mg by mouth daily.    . carbidopa-levodopa (SINEMET IR) 25-100 MG tablet 3 TABLETS BY MOUTH AT 6AM 2 TABLETS AT 9AM 2 TABLETS AT 1 PM 1 TABLETS AT 4PM AND 1 TABLET AT 7PM 270 tablet 1  . clonazePAM (KLONOPIN) 0.5 MG tablet Take 0.5 tablets (0.25 mg total) by mouth at bedtime. 45 tablet 1  . furosemide (LASIX) 20 MG tablet Take 20 mg by mouth daily.    . hydrochlorothiazide (HYDRODIURIL) 12.5 MG tablet Take by mouth.    [name]. PARoxetine (PAXIL) 10 MG tablet TAKE 1 TABLET BY MOUTH (10 MG TOTAL) DAILY 90 tablet 1  . rOPINIRole (REQUIP) 2 MG tablet 2 in the morning, 1 in the afternoon, 1 in the evening 360 tablet 1  . sildenafil (VIAGRA) 100 MG tablet Take 100 mg by mouth as needed for erectile dysfunction.    . tamsulosin (FLOMAX) 0.4 MG CAPS capsule Take by mouth.     No current facility-administered medications on file prior to visit.     Observations/Objective:   There were no vitals filed for this visit. Patient is alert  and oriented x3.  His speech is fluent and clear.    Assessment and Plan:   1.  Idiopathic Parkinson's disease.  The patient has tremor, bradykinesia, rigidity and mild postural instability.             -We discussed the diagnosis as well as pathophysiology of the disease.  We discussed treatment options as well as prognostic indicators.  Patient education was provided.             -Continue carbidopa/levodopa 25/100:  3 tablets of carbidopa/levodopa 25/100 at 6am, 2 tablets at 9am/2 tablets at 1pm/1 tablet at 4pm/1 tablet at 7pm              -He does have carbidopa/levodopa 50/200 at home, but did not find it  beneficial and we decided to hold off on that right now.             -I have reduced previously Requip because of compulsive behaviors.   For now he will continue ropirole 2 mg, 2 tablets in the AM, 1 tablet in the afternoon and 1 tablet in the evening.  Did address compulsive behavior (gambling) which we will need to watch closely.  Hopeful that we can reduce this medication post DBS surgery.             -Levodopa challenge test was done on June 05, 2019 and was markedly positive with an off score of 39 and on score of 15  -Patient had neurocognitive testing with Dr. Milbert Coulter on December 10.  No evidence of neurocognitive concerns  -He does have cardiac clearance, although they did suggest that he may need pulmonary clearance.  I am going to leave that to Dr. Venetia Maxon and his expertise.  -Patient does need a preop MRI of the brain.  I was going to go ahead and get that scheduled the same day he had an appointment with Dr. Venetia Maxon.  He told me he had an appointment Dr. Venetia Maxon on January 11, but when I called their office after I got off the phone with the patient, they stated that the patient missed his appointment in December and does not have a follow-up.  I will reach back out to the patient and let him know he needs to make a follow-up appointment with Dr. Fredrich Birks office and we will schedule the MRI of the brain on the same day if that is possible.  I will not send the order for the MRI of the brain until he is able to get an appointment with Dr. Venetia Maxon.  -Patient and I did discuss the fact that he will need support following DBS.  He does state that he thinks his daughter is going to come up and stay with him.  We also talked about the fact that he may need to stay in [place] in a hotel following DBS surgery.  He will talk to Dr. Venetia Maxon about that.  Talked again about risks, benefits, and side effects of DBS surgery.  Talked about the details of it including the stages of the surgeries.  Patient expressed  understanding.  2.  Depression             -Continue Paxil, 10 mg daily.  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  3.  REM behavior disorder             -Patient only using clonazepam 0.25 mg at bedtime as needed.  Told him we could raise the dose to see if that  helped more, but right now he did not want to do that.  He is really using it primarily for intermittent insomnia.  Follow Up Instructions:  as above.  Will depend on MRI/DBS.  -I discussed the assessment and treatment plan with the patient. The patient was provided an opportunity to ask questions and all were answered. The patient agreed with the plan and demonstrated an understanding of the instructions.   The patient was advised to call back or seek an in-person evaluation if the symptoms worsen or if the condition fails to improve as anticipated.    Total Time spent in visit with the patient was:  25 min, of which more than 50% of the time was spent in counseling re: dbs.   Pt understands and agrees with the plan of care outlined.     Alonza Bogus, DO

## 2019-08-19 ENCOUNTER — Telehealth: Payer: Self-pay

## 2019-08-19 ENCOUNTER — Encounter: Payer: Self-pay | Admitting: Neurology

## 2019-08-19 ENCOUNTER — Telehealth (INDEPENDENT_AMBULATORY_CARE_PROVIDER_SITE_OTHER): Payer: BC Managed Care – PPO | Admitting: Neurology

## 2019-08-19 ENCOUNTER — Other Ambulatory Visit: Payer: Self-pay

## 2019-08-19 DIAGNOSIS — G4752 REM sleep behavior disorder: Secondary | ICD-10-CM | POA: Diagnosis not present

## 2019-08-19 DIAGNOSIS — G20 Parkinson's disease: Secondary | ICD-10-CM

## 2019-08-19 DIAGNOSIS — F329 Major depressive disorder, single episode, unspecified: Secondary | ICD-10-CM

## 2019-08-19 NOTE — Telephone Encounter (Signed)
"  Make sure pt knows to r/s [name] [name] appt and let him know that I won't schedule MRI appt until we know when stern appt is so he needs to keep [place] in loop."  Left message explaining this to the patient and advised [facility] office as well.

## 2019-08-20 ENCOUNTER — Encounter: Payer: Self-pay | Admitting: Psychology

## 2019-08-20 ENCOUNTER — Ambulatory Visit (INDEPENDENT_AMBULATORY_CARE_PROVIDER_SITE_OTHER): Payer: BC Managed Care – PPO | Admitting: Psychology

## 2019-08-20 DIAGNOSIS — G20 Parkinson's disease: Secondary | ICD-10-CM

## 2019-08-20 NOTE — Progress Notes (Signed)
   Neuropsychology Feedback Session [name] [name]. [place] [facility]  Reason for Referral:   [name] [name] a 58 y.o. Caucasian male referred by Alonza Bogus, D.O.,to characterize hiscurrent cognitive functioning and assist with diagnostic clarity and treatment planning in the context of Parkinson's disease and consideration of DBS surgery.  Feedback:   Dr. Earley Favor completed a comprehensive neuropsychological evaluation on 08/13/2019. Please refer to that encounter for the full report and recommendations. Briefly, results suggested neuropsychological functioning within normal limits. An isolated weakness was exhibited across response inhibition. However, performance across domains of processing speed, attention/concentration, other aspects of executive functioning, receptive and expressive language, visuospatial functioning, and learning and memory were within normal limits. Overall, Dr. Earley Favor appears to be an appropriate candidate for DBS.  Dr. Earley Favor was unaccompanied. Content of the current session focused on the results of his neuropsychological evaluation. Dr. Earley Favor was given the opportunity to ask questions and his questions were answered. He was also encouraged to reach out should additional questions arise.     A total of 10 minutes were spent with [name] during the current feedback session.

## 2019-08-21 ENCOUNTER — Telehealth: Payer: Self-pay | Admitting: Neurology

## 2019-08-21 DIAGNOSIS — G20 Parkinson's disease: Secondary | ICD-10-CM

## 2019-08-21 DIAGNOSIS — Z01818 Encounter for other preprocedural examination: Secondary | ICD-10-CM

## 2019-08-21 NOTE — Telephone Encounter (Signed)
Scheduled 09/14/19 at 12 pm but must arrive at 1115am for labs due to hypertension diagnosis and the use of contrast. Left detailed on patients VM as allowed per [facility]

## 2019-08-21 NOTE — Telephone Encounter (Signed)
Got note that patient now has appt on 09/14/19 at [facility] at 8:30 AM.  Please order MRI brain with and without with DBS protocol (1 mm slices).  Must be done at hospital on [address].  See if they can do on the same day as patient travels a few hours to get here.  You will need to send order and call them since there is a lot of coordination with this one.  You will need to talk with Richarda Overlie in MRI.  Dx for the MRI is Parkinsons disease and pre-op DBS.

## 2019-09-14 ENCOUNTER — Other Ambulatory Visit: Payer: Self-pay

## 2019-09-14 ENCOUNTER — Ambulatory Visit (HOSPITAL_COMMUNITY)
Admission: RE | Admit: 2019-09-14 | Discharge: 2019-09-14 | Disposition: A | Discharge status: Home / Self Care | Payer: BC Managed Care – PPO | Source: Ambulatory Visit | Attending: Neurology | Admitting: Neurology

## 2019-09-14 DIAGNOSIS — G20 Parkinson's disease: Secondary | ICD-10-CM | POA: Insufficient documentation

## 2019-09-14 DIAGNOSIS — Z01818 Encounter for other preprocedural examination: Secondary | ICD-10-CM | POA: Diagnosis present

## 2019-09-14 LAB — CREATININE, SERUM
Creatinine, Ser: 1.1 mg/dL (ref 0.61–1.24)
GFR calc Af Amer: >60 mL/min (ref >60)
GFR calc non Af Amer: >60 mL/min (ref >60)

## 2019-09-14 MED ORDER — GADOBUTROL 1 MMOL/ML IV SOLN
10.0000 mL | Freq: Once | INTRAVENOUS | Status: AC | PRN
  Administered 2019-09-14: 10 mL via INTRAVENOUS

## 2019-09-29 ENCOUNTER — Other Ambulatory Visit: Payer: Self-pay | Admitting: Neurosurgery

## 2019-10-09 ENCOUNTER — Other Ambulatory Visit: Payer: Self-pay

## 2019-10-09 MED ORDER — CARBIDOPA-LEVODOPA 25-100 MG PO TABS: ORAL_TABLET | ORAL | 1 refills | Status: DC

## 2019-10-11 ENCOUNTER — Other Ambulatory Visit: Payer: Self-pay | Admitting: Neurology

## 2019-10-15 ENCOUNTER — Other Ambulatory Visit: Payer: Self-pay

## 2019-10-15 ENCOUNTER — Telehealth: Payer: Self-pay | Admitting: Neurology

## 2019-10-15 MED ORDER — CARBIDOPA-LEVODOPA 25-100 MG PO TABS: ORAL_TABLET | ORAL | 1 refills | Status: DC

## 2019-10-15 NOTE — Telephone Encounter (Signed)
The following message was left with [facility] on 10/15/19 at 12:16 PM.  Caller from pharmacy states she is calling for a refill for a patient on carbidopa-levodopa 25-100mg .  Karin Golden [phone]

## 2019-10-15 NOTE — Telephone Encounter (Signed)
Sent!

## 2019-10-28 NOTE — H&P (Signed)
Patient ID:   [id number] Patient: [name] [name]  Date of Birth: 03/27/1961 Visit Type: Office Visit   Date: 09/30/2019 10:30 AM Provider: Verbie Babic D. [employer] MD   This 58 year old male presents for DBS Eval.  HISTORY OF PRESENT ILLNESS:  1.  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 Tat's referral.  Patient reports symptoms since 2005.  History:  Enlarged aorta, HTN, Parkinson's, hypothyroidism, low testosterone Surgical history:  Cholecystectomy and LASIK  Patient states cardiac workup recently through [employer] [facility]ology was negative.   Tentatively planned for fiducials March 16th, DBS March 23rd, and IPG March 30th.  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  He is able to teach [employer] which is largely a digital process in the dental school.  The patient notes primarily problems with rigidity and does have some tremor but is mostly complaining of stiffness and says that his tremor is worse in his right leg and his symptoms are worse on the right side than the left.  Patient has had cardiac clearance for surgery.  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          PAST MEDICAL/SURGICAL HISTORY:   (Reviewed, updated)    Disease/disorder Onset Date Management Date Comments    Cholecystectomy    Depression      Elevated lipids      Hypertension         PAST MEDICAL HISTORY, SURGICAL HISTORY, FAMILY HISTORY, SOCIAL HISTORY AND REVIEW OF SYSTEMS I have reviewed the patient's past medical, surgical, family and social history as well as the comprehensive review of systems as included on the [facility]  history form dated 09/30/2019, which I have signed.  Family History:  (Reviewed, updated) Relationship Family Member Name Deceased Age at Death Condition Onset Age Cause of Death      Family history of Hypertension  N      Family history of Diabetes mellitus  N  Father    Congestive heart failure  N  Mother    Hypertension  N  Mother    Arthritis  N     Social History:  (Reviewed, updated) Tobacco use reviewed. Preferred language is English.   Tobacco use status: Current non-smoker. Smoking status: Never smoker.  SMOKING STATUS Type Smoking Status Usage Per Day Years Used Total Pack Years   Never smoker          MEDICATIONS: (added, continued or stopped this visit) Started Medication Directions Instruction Stopped  04/20/2019 Flomax 0.4 mg capsule Take by mouth.    04/16/2016 furosemide 20 mg tablet Take 20 mg by mouth daily.    04/20/2019 hydrochlorothiazide 12.5 mg tablet Take by mouth.    05/21/2019 Klonopin 0.5 mg tablet Take 0.5 tablets (0.25 mg total) by mouth at bedtime.    06/12/2018 Lipitor 40 mg tablet Take 40 mg by mouth daily.    03/09/2019 paroxetine 10 mg tablet TAKE 1 TABLET BY MOUTH (10 MG TOTAL) DAILY    04/21/2019 rOPINIRole (REQUIP) 2 MG tablet 2 in the morning, 1 in the afternoon, 1 in the evening    12/12/2017 rOPINIRole (REQUIP)   4 mg Oral Tablet Take 8 mg by mouth twice a day.    12/12/2017 Sinemet 25 mg-250 mg tablet Take 1 Tab by mouth three times a day.    07/18/2017 Viagra 100 mg tablet Take 100 mg by mouth as needed for erectile dysfunction.       ALLERGIES: Ingredient Reaction Medication Name Comment  NO KNOWN ALLERGIES     No known allergies. Reviewed, updated.   REVIEW OF SYSTEMS   See scanned patient registration form, dated 09/30/2019, signed and dated on 09/30/2019  Review of Systems Details System Neg/Pos Details  Constitutional Negative Chills, Fatigue, Fever, Malaise, Night sweats, Weight gain and Weight loss.  ENMT Negative  Ear drainage, Hearing loss, Nasal drainage, Otalgia, Sinus pressure and Sore throat.  Eyes Negative Eye discharge, Eye pain and Vision changes.  Respiratory Negative Chronic cough, Cough, Dyspnea, Known TB exposure and Wheezing.  Cardio Negative Chest pain, Claudication, Edema and Irregular heartbeat/palpitations.  GI Negative Abdominal pain, Blood in stool, Change in stool pattern, Constipation, Decreased appetite, Diarrhea, Heartburn, Nausea and Vomiting.  GU Negative Dribbling, Dysuria, Erectile dysfunction, Hematuria, Polyuria (Genitourinary), Slow stream, Urinary frequency, Urinary incontinence and Urinary retention.  Endocrine Negative Cold intolerance, Heat intolerance, Polydipsia and Polyphagia.  Neuro Positive Tremors.  Psych Negative Anxiety, Depression and Insomnia.  Integumentary Negative Brittle hair, Brittle nails, Change in shape/size of mole(s), Hair loss, Hirsutism, Hives, Pruritus, Rash and Skin lesion.  MS Negative Back pain, Joint pain, Joint swelling, Muscle weakness and Neck pain.  Hema/Lymph Negative Easy bleeding, Easy bruising and Lymphadenopathy.  Allergic/Immuno Negative Contact allergy, Environmental allergies, Food allergies and Seasonal allergies.  Reproductive Negative Penile discharge and Sexual dysfunction.   PHYSICAL EXAM:   Vitals Date Temp F BP Pulse Ht In Wt Lb BMI BSA Pain Score  09/30/2019 91.2 146/76 81 73 342.6 45.2  0/10    PHYSICAL EXAM Details General Level of Distress: no acute distress Overall Appearance: obese  Head and Face  Right Left  Fundoscopic Exam:  normal normal    Cardiovascular Cardiac: regular rate and rhythm without murmur  Right Left  Carotid Pulses: normal normal  Respiratory Lungs: clear to auscultation  Neurological Orientation: normal Recent and Remote Memory: normal Attention Span and Concentration:   normal Language: normal Fund of Knowledge: normal  Right Left Sensation: normal normal Upper Extremity  Coordination: normal normal  Lower Extremity Coordination: normal normal  Musculoskeletal Gait and Station: normal  Right Left Upper Extremity Muscle Strength: normal normal Lower Extremity Muscle Strength: normal normal Upper Extremity Muscle Tone:  tremor tremor Lower Extremity Muscle Tone: tremor tremor   Motor Strength Upper and lower extremity motor strength was tested in the clinically pertinent muscles.     Deep Tendon Reflexes  Right Left Biceps: normal normal Triceps: normal normal Brachioradialis: normal normal Patellar: normal normal Achilles: normal normal  Sensory Sensation was tested at L1 to S1.   Cranial Nerves II. Optic Nerve/Visual Fields: normal III. Oculomotor: normal IV. Trochlear: normal V. Trigeminal: normal VI. Abducens: normal VII. Facial: normal VIII. Acoustic/Vestibular: normal IX. Glossopharyngeal: normal X. Vagus: normal XI. Spinal Accessory: normal XII. Hypoglossal: normal  Motor and other Tests Lhermittes: negative Rhomberg: negative Pronator drift: absent     Right Left Hoffman's: normal normal Clonus: normal normal Babinski: normal normal SLR: negative negative Patrick's Corky Sox): negative negative Toe Walk: normal normal Toe Lift: normal normal Heel Walk: normal normal SI Joint: nontender nontender   Additional Findings:  The patient has rigidity and tremor, right greater than left.  He notes some diplopia but says this is intermittent.  I do not detect any eye movement abnormalities on examination today    IMPRESSION:   Parkinson's disease with rigidity and bradykinesia as well as tremor.  Patient is morbidly obese.  He has been cleared by his cardiologist to proceed with deep brain stimulation  PLAN:  The patient wishes to proceed with brain stimulation for Parkinson's and has been scheduled for fiducial placement on 11/17/2019, DBS on 11/24/2019 and IPG placement on 12/01/2019.  I went over the details of the  surgery discuss this with him and showed him models and answered his questions.  We discussed risks and benefits of surgery and he wishes to proceed.  Orders: Instruction(s)/Education: Assessment Instruction  I10 Lifestyle education  [phone] Dietary management education, guidance, and counseling   Completed Orders (this encounter) Order Details Reason Side Interpretation Result Initial Treatment Date Region  Lifestyle education Patient will follow up with Primary Care Physician.        Dietary management education, guidance, and counseling Encouraged patient to eat well balanced diet.         Assessment/Plan   # Detail Type Description   1. Assessment Parkinsons (G20).       2. Assessment Essential (primary) hypertension (I10).       3. Assessment Body mass index (BMI) 45.0-49.9, adult (Z68.42).   Plan Orders Today's instructions / counseling include(s) Dietary management education, guidance, and counseling. Clinical information/comments: Encouraged patient to eat well balanced diet.         Pain Management Plan Pain Scale: 0/10. Method: Numeric Pain Intensity Scale.              Provider:  Danae Orleans. Venetia Maxon MD  10/02/2019 02:55 PM    Dictation edited by: Danae Orleans. Venetia Maxon    CC Providers: Lurena Joiner Tat  [address] [place], [place] [address]               Electronically signed by Danae Orleans Venetia Maxon MD on 10/02/2019 02:55 PM

## 2019-10-28 NOTE — H&P (View-Only) (Signed)
Patient ID:   [id number] Patient: [name] [name]  Date of Birth: 10-12-1960 Visit Type: Office Visit   Date: 09/30/2019 10:30 AM Provider: Danae Orleans. Venetia Maxon MD   This 59 year old male presents for DBS Eval.  HISTORY OF PRESENT ILLNESS:  1.  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 Don Perking referral.  Patient reports symptoms since 2005.  History:  Enlarged aorta, HTN, Parkinson's, hypothyroidism, low testosterone Surgical history:  Cholecystectomy and LASIK  Patient states cardiac workup recently through [facility] cardiology was negative.   Tentatively planned for fiducials March 16th, DBS March 23rd, and IPG March 30th.  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  He is able to teach [employer] which is largely a digital process in the dental school.  The patient notes primarily problems with rigidity and does have some tremor but is mostly complaining of stiffness and says that his tremor is worse in his right leg and his symptoms are worse on the right side than the left.  Patient has had cardiac clearance for surgery.  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          PAST MEDICAL/SURGICAL HISTORY:   (Reviewed, updated)    Disease/disorder Onset Date Management Date Comments    Cholecystectomy    Depression      Elevated lipids      Hypertension         PAST MEDICAL HISTORY, SURGICAL HISTORY, FAMILY HISTORY, SOCIAL HISTORY AND REVIEW OF SYSTEMS I have reviewed the patient's past medical, surgical, family and social history as well as the comprehensive review of systems as included on the [facility]  history form dated 09/30/2019, which I have signed.  Family History:  (Reviewed, updated) Relationship Family Member Name Deceased Age at Death Condition Onset Age Cause of Death      Family history of Hypertension  N      Family history of Diabetes mellitus  N  Father    Congestive heart failure  N  Mother    Hypertension  N  Mother    Arthritis  N     Social History:  (Reviewed, updated) Tobacco use reviewed. Preferred language is [place].   Tobacco use status: Current non-smoker. Smoking status: Never smoker.  SMOKING STATUS Type Smoking Status Usage Per Day Years Used Total Pack Years   Never smoker          MEDICATIONS: (added, continued or stopped this visit) Started Medication Directions Instruction Stopped  04/20/2019 Flomax 0.4 mg capsule Take by mouth.    04/16/2016 furosemide 20 mg tablet Take 20 mg by mouth daily.    04/20/2019 hydrochlorothiazide 12.5 mg tablet Take by mouth.    05/21/2019 Klonopin 0.5 mg tablet Take 0.5 tablets (0.25 mg total) by mouth at bedtime.    06/12/2018 Lipitor 40 mg tablet Take 40 mg by mouth daily.    03/09/2019 paroxetine 10 mg tablet TAKE 1 TABLET BY MOUTH (10 MG TOTAL) DAILY    04/21/2019 rOPINIRole (REQUIP) 2 MG tablet 2 in the morning, 1 in the afternoon, 1 in the evening    12/12/2017 rOPINIRole (REQUIP)  4 mg Oral Tablet Take 8 mg by mouth twice a day.    12/12/2017 Sinemet 25 mg-250 mg tablet Take 1 Tab by mouth three times a day.    07/18/2017 Viagra 100 mg tablet Take 100 mg by mouth as needed for erectile dysfunction.       ALLERGIES: Ingredient Reaction Medication Name Comment  NO KNOWN ALLERGIES     No known allergies. Reviewed, updated.   REVIEW OF SYSTEMS   See scanned patient registration form, dated 09/30/2019, signed and dated on 09/30/2019  Review of Systems Details System Neg/Pos Details  Constitutional Negative Chills, Fatigue, Fever, Malaise, Night sweats, Weight gain and Weight loss.  ENMT Negative  Ear drainage, Hearing loss, Nasal drainage, Otalgia, Sinus pressure and Sore throat.  Eyes Negative Eye discharge, Eye pain and Vision changes.  Respiratory Negative Chronic cough, Cough, Dyspnea, Known TB exposure and Wheezing.  Cardio Negative Chest pain, Claudication, Edema and Irregular heartbeat/palpitations.  GI Negative Abdominal pain, Blood in stool, Change in stool pattern, Constipation, Decreased appetite, Diarrhea, Heartburn, Nausea and Vomiting.  GU Negative Dribbling, Dysuria, Erectile dysfunction, Hematuria, Polyuria (Genitourinary), Slow stream, Urinary frequency, Urinary incontinence and Urinary retention.  Endocrine Negative Cold intolerance, Heat intolerance, Polydipsia and Polyphagia.  Neuro Positive Tremors.  Psych Negative Anxiety, Depression and Insomnia.  Integumentary Negative Brittle hair, Brittle nails, Change in shape/size of mole(s), Hair loss, Hirsutism, Hives, Pruritus, Rash and Skin lesion.  MS Negative Back pain, Joint pain, Joint swelling, Muscle weakness and Neck pain.  Hema/Lymph Negative Easy bleeding, Easy bruising and Lymphadenopathy.  Allergic/Immuno Negative Contact allergy, Environmental allergies, Food allergies and Seasonal allergies.  Reproductive Negative Penile discharge and Sexual dysfunction.   PHYSICAL EXAM:   Vitals Date Temp F BP Pulse Ht In Wt Lb BMI BSA Pain Score  09/30/2019 91.2 146/76 81 73 342.6 45.2  0/10    PHYSICAL EXAM Details General Level of Distress: no acute distress Overall Appearance: obese  Head and Face  Right Left  Fundoscopic Exam:  normal normal    Cardiovascular Cardiac: regular rate and rhythm without murmur  Right Left  Carotid Pulses: normal normal  Respiratory Lungs: clear to auscultation  Neurological Orientation: normal Recent and Remote Memory: normal Attention Span and Concentration:   normal Language: normal Fund of Knowledge: normal  Right Left Sensation: normal normal Upper Extremity  Coordination: normal normal  Lower Extremity Coordination: normal normal  Musculoskeletal Gait and Station: normal  Right Left Upper Extremity Muscle Strength: normal normal Lower Extremity Muscle Strength: normal normal Upper Extremity Muscle Tone:  tremor tremor Lower Extremity Muscle Tone: tremor tremor   Motor Strength Upper and lower extremity motor strength was tested in the clinically pertinent muscles.     Deep Tendon Reflexes  Right Left Biceps: normal normal Triceps: normal normal Brachioradialis: normal normal Patellar: normal normal Achilles: normal normal  Sensory Sensation was tested at L1 to S1.   Cranial Nerves II. Optic Nerve/Visual Fields: normal III. Oculomotor: normal IV. Trochlear: normal V. Trigeminal: normal VI. Abducens: normal VII. Facial: normal VIII. Acoustic/Vestibular: normal IX. Glossopharyngeal: normal X. Vagus: normal XI. Spinal Accessory: normal XII. Hypoglossal: normal  Motor and other Tests Lhermittes: negative Rhomberg: negative Pronator drift: absent     Right Left Hoffman's: normal normal Clonus: normal normal Babinski: normal normal SLR: negative negative Patrick's Corky Sox): negative negative Toe Walk: normal normal Toe Lift: normal normal Heel Walk: normal normal SI Joint: nontender nontender   Additional Findings:  The patient has rigidity and tremor, right greater than left.  He notes some diplopia but says this is intermittent.  I do not detect any eye movement abnormalities on examination today    IMPRESSION:   Parkinson's disease with rigidity and bradykinesia as well as tremor.  Patient is morbidly obese.  He has been cleared by his cardiologist to proceed with deep brain stimulation  PLAN:  The patient wishes to proceed with brain stimulation for Parkinson's and has been scheduled for fiducial placement on 11/17/2019, DBS on 11/24/2019 and IPG placement on 12/01/2019.  I went over the details of the  surgery discuss this with him and showed him models and answered his questions.  We discussed risks and benefits of surgery and he wishes to proceed.  Orders: Instruction(s)/Education: Assessment Instruction  I10 Lifestyle education  [phone] Dietary management education, guidance, and counseling   Completed Orders (this encounter) Order Details Reason Side Interpretation Result Initial Treatment Date Region  Lifestyle education Patient will follow up with Primary Care Physician.        Dietary management education, guidance, and counseling Encouraged patient to eat well balanced diet.         Assessment/Plan   # Detail Type Description   1. Assessment Parkinsons (G20).       2. Assessment Essential (primary) hypertension (I10).       3. Assessment Body mass index (BMI) 45.0-49.9, adult (Z68.42).   Plan Orders Today's instructions / counseling include(s) Dietary management education, guidance, and counseling. Clinical information/comments: Encouraged patient to eat well balanced diet.         Pain Management Plan Pain Scale: 0/10. Method: Numeric Pain Intensity Scale.              Provider:  Danae Orleans. Venetia Maxon MD  10/02/2019 02:55 PM    Dictation edited by: Danae Orleans. Venetia Maxon    CC Providers: Lurena Joiner Tat  [address] [place], [place] [address]               Electronically signed by Danae Orleans Venetia Maxon MD on 10/02/2019 02:55 PM

## 2019-11-02 ENCOUNTER — Other Ambulatory Visit: Payer: Self-pay | Admitting: Neurosurgery

## 2019-11-02 DIAGNOSIS — G20 Parkinson's disease: Secondary | ICD-10-CM

## 2019-11-03 NOTE — Progress Notes (Signed)
Virtual Visit via Video Note The purpose of this virtual visit is to provide medical care while limiting exposure to the novel coronavirus.    Consent was obtained for video visit:  Yes.   Answered questions that patient had about telehealth interaction:  Yes.   I discussed the limitations, risks, security and privacy concerns of performing an evaluation and management service by telemedicine. I also discussed with the patient that there may be a patient responsible charge related to this service. The patient expressed understanding and agreed to proceed.  Pt location: Home Physician Location: home Name of referring provider:  No ref. provider found I connected with [name] [name] at patients initiation/request on 11/04/2019 at 10:15 AM EST by video enabled telemedicine application and verified that I am speaking with the correct person using two identifiers. Pt MRN:  [id number] Pt DOB:  1961/02/08 Video Participants:  [name] [name];     History of Present Illness:  Patient seen today in follow-up for anticipation of preop DBS.  Patient expresses desire to proceed.  He has had no falls since last visit.  No hallucinations.  No lightheadedness or near syncope.  Has seen Dr. Venetia Maxon.  Has had preop MRI completed.  Brings a list of questions with him, including whether or not he needed to have someone with him with each stage of surgery.  Current movement d/o meds:  carbidopa/levodopa 25/100 3 tablets at 6 AM/2 tablets at 9 AM/2 tablets at 1 PM/1 tablet at 4 PM/1 tablet at 7 PM  Requip 2 mg, 2 tablets in the morning, 1 tablet in the afternoon and 1 tablet at the evening.  Klonopin 0.5mg , 1/2 q hs prn   Current Outpatient Medications on File Prior to Visit  Medication Sig Dispense Refill  . atorvastatin (LIPITOR) 40 MG tablet Take 40 mg by mouth daily.    . carbidopa-levodopa (SINEMET IR) 25-100 MG tablet 3 TABLETS BY MOUTH AT 6AM 2 TABLETS AT 9AM 2 TABLETS AT 1 PM 1 TABLETS AT 4PM AND 1  TABLET AT 7PM 270 tablet 1  . clonazePAM (KLONOPIN) 0.5 MG tablet Take 0.5 tablets (0.25 mg total) by mouth at bedtime. 45 tablet 1  . furosemide (LASIX) 20 MG tablet Take 20 mg by mouth daily.    . hydrochlorothiazide (HYDRODIURIL) 12.5 MG tablet Take by mouth.    [name] PARoxetine (PAXIL) 10 MG tablet TAKE ONE TABLET BY MOUTH DAILY 90 tablet 0  . rOPINIRole (REQUIP) 2 MG tablet 2 in the morning, 1 in the afternoon, 1 in the evening 360 tablet 1  . sildenafil (VIAGRA) 100 MG tablet Take 100 mg by mouth as needed for erectile dysfunction.    . tamsulosin (FLOMAX) 0.4 MG CAPS capsule Take by mouth.     No current facility-administered medications on file prior to visit.     Observations/Objective:   There were no vitals filed for this visit. GEN:  The patient appears stated age and is in NAD.  Neurological examination:  Orientation: The patient is alert and oriented x3. Cranial nerves: There is good facial symmetry. There is mild facial hypomimia.  The speech is fluent and clear.     Assessment and Plan:   1.  Parkinsons Disease  -Continue carbidopa/levodopa 25/100: 3 tablets at 6 AM/2 tablets at 9 AM/2 tablets at 1 PM/1 tablet at 4 PM/1 tablet at 7 PM  -Continue Requip 2 mg, 2 tablets in the morning, 1 tablet in the afternoon and 1 tablet at the evening.  Hopeful  to reduce this post DBS because of compulsive behavior/gambling  -Levodopa challenge test completed in October, 2020.  Off score 39.  On score 15  -Neurocognitive test with Dr. Melvyn Novas in December, 2010.  No neurocognitive concerns for approaching DBS.  -MRI of the brain has been completed and personally reviewed.  -I talked to the patient about the logistics associated with DBS therapy.  I talked to the patient about risks/benefits/side effects of DBS therapy.  We talked about risks which included but were not limited to infection, paralysis, intraoperative seizure, death, stroke, bleeding around the electrode.   I talked to patient  about fiducial placement 1 week prior to DBS therapy.  I talked to the patient about what to expect in the operating room, including the fact that this is an awake surgery.  We talked about battery placement as well as which is done under general anesthesia, generally approximately one week following the initial surgery.  We also talked about the fact that the patient will need to be off of medications for surgery.  The patient and family were given the opportunity to ask questions, which they did, and I answered them to the best of my ability today.  Pt scheduled for sx on 3/23. Weaning schedule for meds given today prior to sx. patient wrote this down and it was also sent via [facility].  It is anticipated that patient will be bilateral STN surgery, starting on the L side, with boston scientific device (tunneled to L side of chest per patient for hunting).  -Discussed with the patient that he would need to have someone with him with each stage of surgery.  This would include the fiducials, in case he needs sedation.  -Postoperative appointments given to the patient.  2.  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  Pt really only using prn insomnia  Follow Up Instructions:    -I discussed the assessment and treatment plan with the patient. The patient was provided an opportunity to ask questions and all were answered. The patient agreed with the plan and demonstrated an understanding of the instructions.   The patient was advised to call back or seek an in-person evaluation if the symptoms worsen or if the condition fails to improve as anticipated.    Total time spent on today's visit was 40 minutes, including both face-to-face time and nonface-to-face time.  Time included that spent on review of records (prior notes available to me/labs/imaging if pertinent), discussing treatment and goals, answering patient's questions and coordinating care.   Alonza Bogus,  DO

## 2019-11-03 NOTE — Patient Instructions (Signed)
1.  Stop ropinirole after your first dose on Saturday, March 20 2. Your last dose of carbidopa/levodopa will be at 1pm on March 21.  NO parkinsons medications will be taken after this 3. Your last klonopin will be the evening of March 21 4. Surgery is the morning of March 23.

## 2019-11-04 ENCOUNTER — Encounter: Payer: Self-pay | Admitting: Neurology

## 2019-11-04 ENCOUNTER — Telehealth (INDEPENDENT_AMBULATORY_CARE_PROVIDER_SITE_OTHER): Payer: BC Managed Care – PPO | Admitting: Neurology

## 2019-11-04 ENCOUNTER — Other Ambulatory Visit: Payer: Self-pay

## 2019-11-04 VITALS — Ht 73.0 in | Wt 335.0 lb

## 2019-11-04 DIAGNOSIS — F329 Major depressive disorder, single episode, unspecified: Secondary | ICD-10-CM | POA: Diagnosis not present

## 2019-11-04 DIAGNOSIS — Z01818 Encounter for other preprocedural examination: Secondary | ICD-10-CM

## 2019-11-04 DIAGNOSIS — G20 Parkinson's disease: Secondary | ICD-10-CM | POA: Diagnosis not present

## 2019-11-16 MED ORDER — DEXTROSE 5 % IV SOLN
3.0000 g | INTRAVENOUS | Status: DC
  Filled 2019-11-16: qty 3000

## 2019-11-17 ENCOUNTER — Encounter (HOSPITAL_COMMUNITY)
Admission: RE | Disposition: A | Discharge status: Home / Self Care | Payer: Self-pay | Source: Home / Self Care | Attending: Neurosurgery

## 2019-11-17 ENCOUNTER — Ambulatory Visit (HOSPITAL_COMMUNITY)
Admission: RE | Admit: 2019-11-17 | Discharge: 2019-11-17 | Disposition: A | Discharge status: Home / Self Care | Payer: BC Managed Care – PPO | Attending: Neurosurgery | Admitting: Neurosurgery

## 2019-11-17 ENCOUNTER — Ambulatory Visit: Admit: 2019-11-17 | Payer: BC Managed Care – PPO | Admitting: Neurosurgery

## 2019-11-17 ENCOUNTER — Ambulatory Visit (HOSPITAL_COMMUNITY)
Admission: RE | Admit: 2019-11-17 | Discharge: 2019-11-17 | Disposition: A | Discharge status: Home / Self Care | Payer: BC Managed Care – PPO | Source: Ambulatory Visit | Attending: Neurosurgery | Admitting: Neurosurgery

## 2019-11-17 ENCOUNTER — Encounter (HOSPITAL_COMMUNITY): Payer: Self-pay | Admitting: Neurosurgery

## 2019-11-17 ENCOUNTER — Other Ambulatory Visit: Payer: Self-pay

## 2019-11-17 DIAGNOSIS — Z79899 Other long term (current) drug therapy: Secondary | ICD-10-CM | POA: Diagnosis not present

## 2019-11-17 DIAGNOSIS — G20 Parkinson's disease: Secondary | ICD-10-CM

## 2019-11-17 DIAGNOSIS — Z6841 Body Mass Index (BMI) 40.0 and over, adult: Secondary | ICD-10-CM | POA: Insufficient documentation

## 2019-11-17 DIAGNOSIS — I10 Essential (primary) hypertension: Secondary | ICD-10-CM | POA: Insufficient documentation

## 2019-11-17 DIAGNOSIS — F329 Major depressive disorder, single episode, unspecified: Secondary | ICD-10-CM | POA: Insufficient documentation

## 2019-11-17 HISTORY — PX: MINOR PLACEMENT OF FIDUCIAL: SHX6748

## 2019-11-17 SURGERY — MINOR PLACEMENT OF FIDUCIAL
Anesthesia: LOCAL

## 2019-11-17 SURGERY — MINOR PLACEMENT OF FIDUCIAL
Anesthesia: Monitor Anesthesia Care

## 2019-11-17 MED ORDER — BACITRACIN-NEOMYCIN-POLYMYXIN OINTMENT TUBE
TOPICAL_OINTMENT | CUTANEOUS | Status: AC
  Filled 2019-11-17: qty 14.17

## 2019-11-17 MED ORDER — CEPHALEXIN 500 MG PO CAPS: 500.0000 mg | ORAL_CAPSULE | Freq: Three times a day (TID) | ORAL | 0 refills | Status: AC

## 2019-11-17 MED ORDER — HYDROCODONE-ACETAMINOPHEN 5-325 MG PO TABS: 1.0000 | ORAL_TABLET | Freq: Four times a day (QID) | ORAL | 0 refills | Status: DC | PRN

## 2019-11-17 MED ORDER — LIDOCAINE-EPINEPHRINE 1 %-1:100000 IJ SOLN
INTRAMUSCULAR | Status: AC
  Filled 2019-11-17: qty 1

## 2019-11-17 MED ORDER — SODIUM BICARBONATE 4 % IV SOLN
5.0000 mL | INTRAVENOUS | Status: DC
  Filled 2019-11-17: qty 5

## 2019-11-17 SURGICAL SUPPLY — 19 items
BAG ATCL THK3 35X25 (MISCELLANEOUS) ×1 IMPLANT
BAG BIOHAZARD 25X35 (MISCELLANEOUS) ×2
BLADE CLIPPER SPEC (BLADE) ×3 IMPLANT
BLADE SURG 11 STRL SS (BLADE) ×3 IMPLANT
BNDG ADH 1X3 SHEER STRL LF (GAUZE/BANDAGES/DRESSINGS) ×15 IMPLANT
COVER BACK TABLE 60X90IN (DRAPES) ×3 IMPLANT
COVER WAND RF STERILE (DRAPES) ×3 IMPLANT
DRAPE HALF SHEET 40X57 (DRAPES) ×3 IMPLANT
DRAPE SHEET LG 3/4 BI-LAMINATE (DRAPES) ×3 IMPLANT
GAUZE SPONGE 4X4 12PLY STRL (GAUZE/BANDAGES/DRESSINGS) ×9 IMPLANT
GLOVE BIO SURGEON STRL SZ8 (GLOVE) ×6 IMPLANT
GLOVE ECLIPSE 8.5 STRL (GLOVE) ×3 IMPLANT
NEEDLE HYPO 18GX1.5 BLUNT FILL (NEEDLE) ×3 IMPLANT
NEEDLE HYPO 25X1 1.5 SAFETY (NEEDLE) ×3 IMPLANT
SOL PREP POV-IOD 4OZ 10% (MISCELLANEOUS) ×3 IMPLANT
STAPLER SKIN PROX WIDE 3.9 (STAPLE) ×3 IMPLANT
SUT ETHILON 3 0 PS 1 (SUTURE) ×15 IMPLANT
SYR CONTROL 10ML LL (SYRINGE) ×3 IMPLANT
TOWEL GREEN STERILE (TOWEL DISPOSABLE) ×3 IMPLANT

## 2019-11-17 NOTE — Progress Notes (Signed)
Patient discharged from short stay in wheelchair by Brain Poteat, RN to CT.

## 2019-11-17 NOTE — Interval H&P Note (Signed)
History and Physical Interval Note:  11/17/2019 8:49 AM  [name]  has presented today for surgery, with the diagnosis of Parkinsons.  The various methods of treatment have been discussed with the patient and family. After consideration of risks, benefits and other options for treatment, the patient has consented to  Procedure(s) with comments: Fiducial placement (N/A) - Fiducial placement as a surgical intervention.  The patient's history has been reviewed, patient examined, no change in status, stable for surgery.  I have reviewed the patient's chart and labs.  Questions were answered to the patient's satisfaction.     [name] [name]

## 2019-11-17 NOTE — Op Note (Signed)
11/17/2019  8:49 AM  PATIENT:  [name] [name]  59 y.o. male  PRE-OPERATIVE DIAGNOSIS:  Parkinsons  POST-OPERATIVE DIAGNOSIS:  Same  PROCEDURE:  Procedure(s) with comments: Fiducial placement (N/A) - Fiducial placement  SURGEON:  Surgeon(s) and Role:    [name], MD - Primary  PHYSICIAN ASSISTANT:   ASSISTANTS: Poteat, RN   ANESTHESIA:   local  EBL: Minimal  BLOOD ADMINISTERED:none  DRAINS: none   LOCAL MEDICATIONS USED:  LIDOCAINE   SPECIMEN:  No Specimen  DISPOSITION OF SPECIMEN:  N/A  COUNTS:  YES  TOURNIQUET:  * No tourniquets in log *  DICTATION: Indications:  Patient has essential tremor and presents for Star Fix Fiducial Placement for upcoming DBS VIM placement.    Procedure:  Patient was brought to the preop area.  His scalp had been shaved.  Areas of planned fiducial placement were marked, scalp was prepped with betadine.  Scalp was infiltrated with lidocaine with epinephrine.  Four fiducials were placed according to standard landmarks through stab incisions.  3-0 Nylon sutures were placed and sterile dressings were applied.  Patient tolerated procedure well.  He was taken to CT for treatment planning and then discharged home.  PLAN OF CARE: Discharge  PATIENT DISPOSITION:  Short Stay   Delay start of Pharmacological VTE agent (>24hrs) due to surgical blood loss or risk of bleeding: yes

## 2019-11-17 NOTE — Brief Op Note (Signed)
11/17/2019  8:49 AM  PATIENT:  [name] [name]  58 y.o. male  PRE-OPERATIVE DIAGNOSIS:  Parkinsons  POST-OPERATIVE DIAGNOSIS:  Same  PROCEDURE:  Procedure(s) with comments: Fiducial placement (N/A) - Fiducial placement  SURGEON:  Surgeon(s) and Role:    * [name] Bocchino, MD - Primary  PHYSICIAN ASSISTANT:   ASSISTANTS: Poteat, RN   ANESTHESIA:   local  EBL: Minimal  BLOOD ADMINISTERED:none  DRAINS: none   LOCAL MEDICATIONS USED:  LIDOCAINE   SPECIMEN:  No Specimen  DISPOSITION OF SPECIMEN:  N/A  COUNTS:  YES  TOURNIQUET:  * No tourniquets in log *  DICTATION: Indications:  Patient has essential tremor and presents for Star Fix Fiducial Placement for upcoming DBS VIM placement.    Procedure:  Patient was brought to the preop area.  His scalp had been shaved.  Areas of planned fiducial placement were marked, scalp was prepped with betadine.  Scalp was infiltrated with lidocaine with epinephrine.  Four fiducials were placed according to standard landmarks through stab incisions.  3-0 Nylon sutures were placed and sterile dressings were applied.  Patient tolerated procedure well.  He was taken to CT for treatment planning and then discharged home.  PLAN OF CARE: Discharge  PATIENT DISPOSITION:  Short Stay   Delay start of Pharmacological VTE agent (>24hrs) due to surgical blood loss or risk of bleeding: yes 

## 2019-11-21 ENCOUNTER — Other Ambulatory Visit (HOSPITAL_COMMUNITY)
Admission: RE | Admit: 2019-11-21 | Discharge: 2019-11-21 | Disposition: A | Discharge status: Home / Self Care | Payer: BC Managed Care – PPO | Source: Ambulatory Visit | Attending: Neurosurgery | Admitting: Neurosurgery

## 2019-11-21 DIAGNOSIS — Z20822 Contact with and (suspected) exposure to covid-19: Secondary | ICD-10-CM | POA: Insufficient documentation

## 2019-11-21 DIAGNOSIS — Z01812 Encounter for preprocedural laboratory examination: Secondary | ICD-10-CM | POA: Insufficient documentation

## 2019-11-21 LAB — SARS CORONAVIRUS 2 (TAT 6-24 HRS): SARS Coronavirus 2: NEGATIVE

## 2019-11-23 ENCOUNTER — Other Ambulatory Visit: Payer: Self-pay

## 2019-11-23 ENCOUNTER — Encounter: Payer: Self-pay | Admitting: *Deleted

## 2019-11-23 NOTE — Progress Notes (Signed)
Denies chest pain or shortness of breath. Reports compliance with quarantine post COVID test. Educated on visitation policy.  

## 2019-11-23 NOTE — Anesthesia Preprocedure Evaluation (Addendum)
Anesthesia Evaluation  Patient identified by MRN, date of birth, ID band Patient awake    Reviewed: Allergy & Precautions, NPO status , Patient's Chart, lab work & pertinent test results  Airway Mallampati: II  TM Distance: >3 FB Neck ROM: Full    Dental  (+) Dental Advisory Given   Pulmonary neg pulmonary ROS,    breath sounds clear to auscultation       Cardiovascular hypertension, Pt. on medications + Peripheral Vascular Disease   Rhythm:Regular Rate:Normal  10/20 Nuclear stress test: Impression: 1) The overall study quality is good with some limitations due to patient  size and some motion. Attenuation correction was not available. 2) SPECT Tc80msestamibi stress myocardial perfusion imaging is normal.  There is no obvious evidence of regional myocardial ischemia or infarction. [facility] toolbox showed SDS=-1 No TID. There was normal wall thickening post stress. The LV ejection fraction was calculated post stress at 56% (normal >50%) which correlates with  Echocardiography  10/20 Echo: Dilated ascending aorta at 4.1 cm. There was insufficient TR detected to calculate RV systolic pressure. The right ventricle is normal in size and function. The left ventricle is normal in size, mild concentric left ventricular hypertrophy, the transmitral spectral Doppler flow pattern is suggestive of impaired LV relaxation, no obvious regional wall motion abnormalities noted, LVEF of 58%.   Neuro/Psych parkinsons    GI/Hepatic negative GI ROS, Neg liver ROS,   Endo/Other  Hypothyroidism Morbid obesity  Renal/GU negative Renal ROS     Musculoskeletal   Abdominal   Peds  Hematology negative hematology ROS (+)   Anesthesia Other Findings   Reproductive/Obstetrics                            Anesthesia Physical Anesthesia Plan  ASA: III  Anesthesia Plan: MAC   Post-op Pain Management:    Induction:  Intravenous  PONV Risk Score and Plan: 1 and Ondansetron and Treatment may vary due to age or medical condition  Airway Management Planned: Natural Airway, Simple Face Mask and Nasal Cannula  Additional Equipment:   Intra-op Plan:   Post-operative Plan:   Informed Consent: I have reviewed the patients History and Physical, chart, labs and discussed the procedure including the risks, benefits and alternatives for the proposed anesthesia with the patient or authorized representative who has indicated his/her understanding and acceptance.       Plan Discussed with: CRNA  Anesthesia Plan Comments:        Anesthesia Quick Evaluation

## 2019-11-24 ENCOUNTER — Inpatient Hospital Stay: Admit: 2019-11-24 | Payer: BC Managed Care – PPO | Admitting: Neurosurgery

## 2019-11-24 ENCOUNTER — Encounter (HOSPITAL_COMMUNITY): Payer: Self-pay | Admitting: Neurosurgery

## 2019-11-24 ENCOUNTER — Inpatient Hospital Stay (HOSPITAL_COMMUNITY): Payer: BC Managed Care – PPO | Admitting: Anesthesiology

## 2019-11-24 ENCOUNTER — Encounter (HOSPITAL_COMMUNITY)
Admission: RE | Disposition: A | Discharge status: Home / Self Care | Payer: Self-pay | Source: Home / Self Care | Attending: Neurosurgery

## 2019-11-24 ENCOUNTER — Inpatient Hospital Stay (HOSPITAL_COMMUNITY)
Admission: RE | Admit: 2019-11-24 | Discharge: 2019-11-25 | DRG: 026 | Disposition: A | Discharge status: Home / Self Care | Payer: BC Managed Care – PPO | Attending: Neurosurgery | Admitting: Neurosurgery

## 2019-11-24 DIAGNOSIS — G20 Parkinson's disease: Secondary | ICD-10-CM | POA: Diagnosis present

## 2019-11-24 DIAGNOSIS — Z8249 Family history of ischemic heart disease and other diseases of the circulatory system: Secondary | ICD-10-CM

## 2019-11-24 DIAGNOSIS — G4752 REM sleep behavior disorder: Secondary | ICD-10-CM | POA: Diagnosis present

## 2019-11-24 DIAGNOSIS — Z9889 Other specified postprocedural states: Secondary | ICD-10-CM

## 2019-11-24 DIAGNOSIS — I10 Essential (primary) hypertension: Secondary | ICD-10-CM | POA: Diagnosis present

## 2019-11-24 DIAGNOSIS — E039 Hypothyroidism, unspecified: Secondary | ICD-10-CM | POA: Diagnosis present

## 2019-11-24 DIAGNOSIS — G20A1 Parkinson's disease without dyskinesia, without mention of fluctuations: Secondary | ICD-10-CM | POA: Diagnosis present

## 2019-11-24 DIAGNOSIS — Z6841 Body Mass Index (BMI) 40.0 and over, adult: Secondary | ICD-10-CM

## 2019-11-24 HISTORY — DX: Personal history of pneumonia (recurrent): Z87.01

## 2019-11-24 HISTORY — PX: SUBTHALAMIC STIMULATOR INSERTION: SHX5375

## 2019-11-24 LAB — BASIC METABOLIC PANEL
Anion gap: 10 (ref 5–15)
BUN: 15 mg/dL (ref 6–20)
CO2: 27 mmol/L (ref 22–32)
Calcium: 9.1 mg/dL (ref 8.9–10.3)
Chloride: 103 mmol/L (ref 98–111)
Creatinine, Ser: 1.11 mg/dL (ref 0.61–1.24)
GFR calc Af Amer: >60 mL/min (ref >60)
GFR calc non Af Amer: >60 mL/min (ref >60)
Glucose, Bld: 86 mg/dL (ref 70–99)
Potassium: 3.7 mmol/L (ref 3.5–5.1)
Sodium: 140 mmol/L (ref 135–145)

## 2019-11-24 LAB — CBC
HCT: 42.3 % (ref 39.0–52.0)
Hemoglobin: 14 g/dL (ref 13.0–17.0)
MCH: 30.6 pg (ref 26.0–34.0)
MCHC: 33.1 g/dL (ref 30.0–36.0)
MCV: 92.4 fL (ref 80.0–100.0)
Platelets: 215 10*3/uL (ref 150–400)
RBC: 4.58 MIL/uL (ref 4.22–5.81)
RDW: 12.9 % (ref 11.5–15.5)
WBC: 6.7 10*3/uL (ref 4.0–10.5)
nRBC: 0 % (ref 0.0–0.2)

## 2019-11-24 LAB — TYPE AND SCREEN
ABO/RH(D): A POS
Antibody Screen: NEGATIVE

## 2019-11-24 LAB — ABO/RH: ABO/RH(D): A POS

## 2019-11-24 SURGERY — SUBTHALAMIC STIMULATOR INSERTION
Anesthesia: Monitor Anesthesia Care | Site: Head | Laterality: Bilateral

## 2019-11-24 SURGERY — SUBTHALAMIC STIMULATOR INSERTION
Anesthesia: Monitor Anesthesia Care | Laterality: Bilateral

## 2019-11-24 MED ORDER — FLEET ENEMA 7-19 GM/118ML RE ENEM: 1.0000 | ENEMA | Freq: Once | RECTAL | Status: DC | PRN

## 2019-11-24 MED ORDER — POLYETHYL GLYCOL-PROPYL GLYCOL 0.4-0.3 % OP GEL: Freq: Every day | OPHTHALMIC | Status: DC | PRN

## 2019-11-24 MED ORDER — CLONAZEPAM 0.5 MG PO TABS: 0.2500 mg | ORAL_TABLET | Freq: Every evening | ORAL | Status: DC | PRN

## 2019-11-24 MED ORDER — ACETAMINOPHEN 325 MG PO TABS: 650.0000 mg | ORAL_TABLET | ORAL | Status: DC | PRN

## 2019-11-24 MED ORDER — ACETAMINOPHEN 650 MG RE SUPP: 650.0000 mg | RECTAL | Status: DC | PRN

## 2019-11-24 MED ORDER — FENTANYL CITRATE (PF) 100 MCG/2ML IJ SOLN: 25.0000 ug | INTRAMUSCULAR | Status: DC | PRN

## 2019-11-24 MED ORDER — CHLORHEXIDINE GLUCONATE CLOTH 2 % EX PADS: 6.0000 | MEDICATED_PAD | Freq: Once | CUTANEOUS | Status: DC

## 2019-11-24 MED ORDER — ONDANSETRON HCL 4 MG/2ML IJ SOLN
INTRAMUSCULAR | Status: AC
  Filled 2019-11-24: qty 2

## 2019-11-24 MED ORDER — SODIUM CHLORIDE 0.9 % IV SOLN
250.0000 mL | INTRAVENOUS | Status: DC
  Administered 2019-11-24: 250 mL via INTRAVENOUS

## 2019-11-24 MED ORDER — FENTANYL CITRATE (PF) 250 MCG/5ML IJ SOLN
INTRAMUSCULAR | Status: AC
  Filled 2019-11-24: qty 5

## 2019-11-24 MED ORDER — CEFAZOLIN SODIUM-DEXTROSE 2-4 GM/100ML-% IV SOLN
2.0000 g | Freq: Three times a day (TID) | INTRAVENOUS | Status: AC
  Administered 2019-11-24 (×2): 2 g via INTRAVENOUS
  Filled 2019-11-24 (×2): qty 100

## 2019-11-24 MED ORDER — ALUM & MAG HYDROXIDE-SIMETH 200-200-20 MG/5ML PO SUSP: 30.0000 mL | Freq: Four times a day (QID) | ORAL | Status: DC | PRN

## 2019-11-24 MED ORDER — CLONAZEPAM 0.25 MG PO TBDP: 0.2500 mg | ORAL_TABLET | Freq: Every evening | ORAL | Status: DC | PRN

## 2019-11-24 MED ORDER — DOCUSATE SODIUM 100 MG PO CAPS: 100.0000 mg | ORAL_CAPSULE | Freq: Two times a day (BID) | ORAL | Status: DC

## 2019-11-24 MED ORDER — ONDANSETRON HCL 4 MG/2ML IJ SOLN: 4.0000 mg | Freq: Once | INTRAMUSCULAR | Status: DC | PRN

## 2019-11-24 MED ORDER — ACETAMINOPHEN 500 MG PO TABS
1000.0000 mg | ORAL_TABLET | Freq: Once | ORAL | Status: DC
  Filled 2019-11-24: qty 2

## 2019-11-24 MED ORDER — CARBIDOPA-LEVODOPA 25-100 MG PO TABS
2.0000 | ORAL_TABLET | Freq: Three times a day (TID) | ORAL | Status: DC
  Administered 2019-11-24 (×2): 2 via ORAL
  Filled 2019-11-24 (×3): qty 2

## 2019-11-24 MED ORDER — SODIUM BICARBONATE 4 % IV SOLN
INTRAVENOUS | Status: DC | PRN
  Administered 2019-11-24: 4 mL via INTRAVENOUS

## 2019-11-24 MED ORDER — PANTOPRAZOLE SODIUM 40 MG IV SOLR: 40.0000 mg | Freq: Every day | INTRAVENOUS | Status: DC

## 2019-11-24 MED ORDER — LACTATED RINGERS IV SOLN: INTRAVENOUS | Status: DC | PRN

## 2019-11-24 MED ORDER — CARBIDOPA-LEVODOPA 25-100 MG PO TABS
3.0000 | ORAL_TABLET | Freq: Every day | ORAL | Status: DC
  Administered 2019-11-25: 3 via ORAL
  Filled 2019-11-24: qty 3

## 2019-11-24 MED ORDER — PHENOL 1.4 % MT LIQD: 1.0000 | OROMUCOSAL | Status: DC | PRN

## 2019-11-24 MED ORDER — FENTANYL CITRATE (PF) 250 MCG/5ML IJ SOLN
INTRAMUSCULAR | Status: DC | PRN
  Administered 2019-11-24 (×2): 25 ug via INTRAVENOUS
  Administered 2019-11-24: 50 ug via INTRAVENOUS
  Administered 2019-11-24 (×2): 25 ug via INTRAVENOUS

## 2019-11-24 MED ORDER — TERBINAFINE HCL 1 % EX CREA
1.0000 "application " | TOPICAL_CREAM | Freq: Every day | CUTANEOUS | Status: DC | PRN
  Filled 2019-11-24: qty 12

## 2019-11-24 MED ORDER — CEFAZOLIN SODIUM 1 G IJ SOLR
INTRAMUSCULAR | Status: AC
  Filled 2019-11-24: qty 30

## 2019-11-24 MED ORDER — DEXMEDETOMIDINE HCL IN NACL 400 MCG/100ML IV SOLN
INTRAVENOUS | Status: DC | PRN
  Administered 2019-11-24: .5 ug/kg/h via INTRAVENOUS

## 2019-11-24 MED ORDER — TAMSULOSIN HCL 0.4 MG PO CAPS: 0.4000 mg | ORAL_CAPSULE | Freq: Every day | ORAL | Status: DC

## 2019-11-24 MED ORDER — MIDAZOLAM HCL 2 MG/2ML IJ SOLN
INTRAMUSCULAR | Status: AC
  Filled 2019-11-24: qty 2

## 2019-11-24 MED ORDER — THROMBIN 5000 UNITS EX SOLR
OROMUCOSAL | Status: DC | PRN
  Administered 2019-11-24: 5 mL via TOPICAL

## 2019-11-24 MED ORDER — DEXTROSE 5 % IV SOLN
INTRAVENOUS | Status: DC | PRN
  Administered 2019-11-24: 08:00:00 3 g via INTRAVENOUS

## 2019-11-24 MED ORDER — FUROSEMIDE 20 MG PO TABS: 20.0000 mg | ORAL_TABLET | Freq: Every day | ORAL | Status: DC

## 2019-11-24 MED ORDER — BISACODYL 10 MG RE SUPP: 10.0000 mg | Freq: Every day | RECTAL | Status: DC | PRN

## 2019-11-24 MED ORDER — SODIUM CHLORIDE 0.9% FLUSH
3.0000 mL | Freq: Two times a day (BID) | INTRAVENOUS | Status: DC
  Administered 2019-11-24: 3 mL via INTRAVENOUS

## 2019-11-24 MED ORDER — ZOLPIDEM TARTRATE 5 MG PO TABS: 5.0000 mg | ORAL_TABLET | Freq: Every evening | ORAL | Status: DC | PRN

## 2019-11-24 MED ORDER — THROMBIN 5000 UNITS EX SOLR
CUTANEOUS | Status: AC
  Filled 2019-11-24: qty 5000

## 2019-11-24 MED ORDER — OXYCODONE HCL 5 MG PO TABS: 5.0000 mg | ORAL_TABLET | ORAL | Status: DC | PRN

## 2019-11-24 MED ORDER — ROPINIROLE HCL 1 MG PO TABS
4.0000 mg | ORAL_TABLET | Freq: Every day | ORAL | Status: DC
  Administered 2019-11-25: 4 mg via ORAL

## 2019-11-24 MED ORDER — ONDANSETRON HCL 4 MG/2ML IJ SOLN: 4.0000 mg | Freq: Four times a day (QID) | INTRAMUSCULAR | Status: DC | PRN

## 2019-11-24 MED ORDER — POLYVINYL ALCOHOL 1.4 % OP SOLN
1.0000 [drp] | Freq: Every day | OPHTHALMIC | Status: DC | PRN
  Filled 2019-11-24: qty 15

## 2019-11-24 MED ORDER — CARBIDOPA-LEVODOPA 25-100 MG PO TABS
2.0000 | ORAL_TABLET | Freq: Three times a day (TID) | ORAL | Status: DC
  Filled 2019-11-24 (×2): qty 2

## 2019-11-24 MED ORDER — MIDAZOLAM HCL 2 MG/2ML IJ SOLN
INTRAMUSCULAR | Status: DC | PRN
  Administered 2019-11-24 (×4): .5 mg via INTRAVENOUS

## 2019-11-24 MED ORDER — POLYETHYLENE GLYCOL 3350 17 G PO PACK: 17.0000 g | PACK | Freq: Every day | ORAL | Status: DC | PRN

## 2019-11-24 MED ORDER — BACITRACIN ZINC 500 UNIT/GM EX OINT
TOPICAL_OINTMENT | CUTANEOUS | Status: AC
  Filled 2019-11-24: qty 28.35

## 2019-11-24 MED ORDER — 0.9 % SODIUM CHLORIDE (POUR BTL) OPTIME
TOPICAL | Status: DC | PRN
  Administered 2019-11-24: 1000 mL

## 2019-11-24 MED ORDER — ROPINIROLE HCL 1 MG PO TABS
2.0000 mg | ORAL_TABLET | Freq: Two times a day (BID) | ORAL | Status: DC
  Administered 2019-11-24: 2 mg via ORAL

## 2019-11-24 MED ORDER — ATORVASTATIN CALCIUM 40 MG PO TABS: 40.0000 mg | ORAL_TABLET | Freq: Every day | ORAL | Status: DC

## 2019-11-24 MED ORDER — ONDANSETRON HCL 4 MG PO TABS: 4.0000 mg | ORAL_TABLET | Freq: Four times a day (QID) | ORAL | Status: DC | PRN

## 2019-11-24 MED ORDER — BACITRACIN ZINC 500 UNIT/GM EX OINT
TOPICAL_OINTMENT | CUTANEOUS | Status: DC | PRN
  Administered 2019-11-24: 1 via TOPICAL

## 2019-11-24 MED ORDER — HYDROCODONE-ACETAMINOPHEN 5-325 MG PO TABS: 1.0000 | ORAL_TABLET | Freq: Four times a day (QID) | ORAL | Status: DC | PRN

## 2019-11-24 MED ORDER — LIDOCAINE-EPINEPHRINE 1 %-1:100000 IJ SOLN
INTRAMUSCULAR | Status: DC | PRN
  Administered 2019-11-24: 18 mL

## 2019-11-24 MED ORDER — SODIUM CHLORIDE 0.9% FLUSH: 3.0000 mL | INTRAVENOUS | Status: DC | PRN

## 2019-11-24 MED ORDER — HYDROCODONE-ACETAMINOPHEN 5-325 MG PO TABS: 2.0000 | ORAL_TABLET | ORAL | Status: DC | PRN

## 2019-11-24 MED ORDER — PROPOFOL 10 MG/ML IV BOLUS
INTRAVENOUS | Status: AC
  Filled 2019-11-24: qty 20

## 2019-11-24 MED ORDER — BUPIVACAINE HCL (PF) 0.5 % IJ SOLN
INTRAMUSCULAR | Status: AC
  Filled 2019-11-24: qty 90

## 2019-11-24 MED ORDER — BUPIVACAINE HCL 0.5 % IJ SOLN
INTRAMUSCULAR | Status: DC | PRN
  Administered 2019-11-24: 18 mL

## 2019-11-24 MED ORDER — KCL IN DEXTROSE-NACL 20-5-0.45 MEQ/L-%-% IV SOLN: INTRAVENOUS | Status: DC

## 2019-11-24 MED ORDER — HYDROMORPHONE HCL 1 MG/ML IJ SOLN: 0.5000 mg | INTRAMUSCULAR | Status: DC | PRN

## 2019-11-24 MED ORDER — LIDOCAINE-EPINEPHRINE 1 %-1:100000 IJ SOLN
INTRAMUSCULAR | Status: AC
  Filled 2019-11-24: qty 3

## 2019-11-24 MED ORDER — HYDROCHLOROTHIAZIDE 25 MG PO TABS: 12.5000 mg | ORAL_TABLET | Freq: Every day | ORAL | Status: DC

## 2019-11-24 MED ORDER — PAROXETINE HCL 10 MG PO TABS
10.0000 mg | ORAL_TABLET | Freq: Every day | ORAL | Status: DC
  Filled 2019-11-24: qty 1

## 2019-11-24 MED ORDER — MENTHOL 3 MG MT LOZG: 1.0000 | LOZENGE | OROMUCOSAL | Status: DC | PRN

## 2019-11-24 SURGICAL SUPPLY — 79 items
BIT DRILL NEURO 2X3.1 SFT TUCH (MISCELLANEOUS) IMPLANT
BLADE CLIPPER SURG (BLADE) ×1 IMPLANT
BLADE SURG 11 STRL SS (BLADE) ×3 IMPLANT
BNDG ADH 1X3 SHEER STRL LF (GAUZE/BANDAGES/DRESSINGS) ×4 IMPLANT
BNDG GAUZE ELAST 4 BULKY (GAUZE/BANDAGES/DRESSINGS) ×2 IMPLANT
BOOT SUTURE AID YELLOW STND (SUTURE) ×1 IMPLANT
CABLE EXTENSION OR (MISCELLANEOUS) ×1 IMPLANT
CABLE LEAD 3.0 GUIDELINE 5.0 (CABLE) ×1 IMPLANT
CANISTER SUCT 3000ML PPV (MISCELLANEOUS) ×2 IMPLANT
CARTRIDGE OIL MAESTRO DRILL (MISCELLANEOUS) ×1 IMPLANT
CLIP RANEY DISP (INSTRUMENTS) IMPLANT
COVER WAND RF STERILE (DRAPES) ×1 IMPLANT
DECANTER SPIKE VIAL GLASS SM (MISCELLANEOUS) ×6 IMPLANT
DIFFUSER DRILL AIR PNEUMATIC (MISCELLANEOUS) ×2 IMPLANT
DRAPE STERI IOBAN 125X83 (DRAPES) IMPLANT
DRILL NEURO 2X3.1 SOFT TOUCH (MISCELLANEOUS)
DRSG OPSITE 4X5.5 SM (GAUZE/BANDAGES/DRESSINGS) ×2 IMPLANT
DRSG OPSITE POSTOP 4X6 (GAUZE/BANDAGES/DRESSINGS) ×2 IMPLANT
DRSG TEGADERM 2-3/8X2-3/4 SM (GAUZE/BANDAGES/DRESSINGS) ×2 IMPLANT
DRSG TELFA 3X8 NADH (GAUZE/BANDAGES/DRESSINGS) IMPLANT
DURAPREP 26ML APPLICATOR (WOUND CARE) ×2 IMPLANT
GAUZE 4X4 16PLY RFD (DISPOSABLE) IMPLANT
GAUZE SPONGE 4X4 12PLY STRL (GAUZE/BANDAGES/DRESSINGS) IMPLANT
GLOVE BIO SURGEON STRL SZ8 (GLOVE) ×3 IMPLANT
GLOVE BIOGEL PI IND STRL 6.5 (GLOVE) IMPLANT
GLOVE BIOGEL PI IND STRL 7.5 (GLOVE) IMPLANT
GLOVE BIOGEL PI IND STRL 8 (GLOVE) ×2 IMPLANT
GLOVE BIOGEL PI IND STRL 8.5 (GLOVE) ×2 IMPLANT
GLOVE BIOGEL PI INDICATOR 6.5 (GLOVE) ×1
GLOVE BIOGEL PI INDICATOR 7.5 (GLOVE) ×2
GLOVE BIOGEL PI INDICATOR 8 (GLOVE) ×1
GLOVE BIOGEL PI INDICATOR 8.5 (GLOVE) ×1
GLOVE ECLIPSE 8.0 STRL XLNG CF (GLOVE) ×3 IMPLANT
GLOVE EXAM NITRILE XL STR (GLOVE) IMPLANT
GLOVE SURG SS PI 6.0 STRL IVOR (GLOVE) ×3 IMPLANT
GOWN STRL REUS W/ TWL LRG LVL3 (GOWN DISPOSABLE) IMPLANT
GOWN STRL REUS W/ TWL XL LVL3 (GOWN DISPOSABLE) ×1 IMPLANT
GOWN STRL REUS W/TWL 2XL LVL3 (GOWN DISPOSABLE) ×2 IMPLANT
GOWN STRL REUS W/TWL LRG LVL3 (GOWN DISPOSABLE) ×1
GOWN STRL REUS W/TWL XL LVL3 (GOWN DISPOSABLE) ×1
HEMOSTAT POWDER KIT SURGIFOAM (HEMOSTASIS) ×2 IMPLANT
KIT BASIN OR (CUSTOM PROCEDURE TRAY) ×2 IMPLANT
KIT COVER BURR HOLE (Miscellaneous) ×2 IMPLANT
KIT HEADREST PASSIVE (NEUROSURGERY SUPPLIES) ×2 IMPLANT
KIT NEUROSTIM DBS 45 8 CONT (Miscellaneous) IMPLANT
KIT NEUROSTIM DBS W/BURR COVER (Miscellaneous) IMPLANT
KIT PLATFORM UNI 4LEG STARFIX (KITS) IMPLANT
KIT SURGICAL STARFIX WAYFORM (KITS) ×1 IMPLANT
KIT SUTURE REMOVAL HAMOT (SET/KITS/TRAYS/PACK) ×2 IMPLANT
KIT TURNOVER KIT B (KITS) ×2 IMPLANT
LEAD KIT DBS DIRECTIONAL 45C (Miscellaneous) ×2 IMPLANT
MARKER SKIN DUAL TIP RULER LAB (MISCELLANEOUS) ×3 IMPLANT
NDL HYPO 25X1 1.5 SAFETY (NEEDLE) ×2 IMPLANT
NDL SPNL 18GX3.5 QUINCKE PK (NEEDLE) IMPLANT
NDL SPNL 22GX3.5 QUINCKE BK (NEEDLE) IMPLANT
NEEDLE HYPO 25X1 1.5 SAFETY (NEEDLE) ×2 IMPLANT
NEEDLE SPNL 18GX3.5 QUINCKE PK (NEEDLE) IMPLANT
NEEDLE SPNL 22GX3.5 QUINCKE BK (NEEDLE) IMPLANT
NS IRRIG 1000ML POUR BTL (IV SOLUTION) ×2 IMPLANT
OIL CARTRIDGE MAESTRO DRILL (MISCELLANEOUS) ×2
PACK LAMINECTOMY NEURO (CUSTOM PROCEDURE TRAY) ×2 IMPLANT
PAD ARMBOARD 7.5X6 YLW CONV (MISCELLANEOUS) ×6 IMPLANT
PAD DRESSING TELFA 3X8 NADH (GAUZE/BANDAGES/DRESSINGS) ×1 IMPLANT
PERFORATOR LRG  14-11MM (BIT) ×1
PERFORATOR LRG 14-11MM (BIT) ×1 IMPLANT
PLATEFORM ARRAY DZAP LEADPOINT (MISCELLANEOUS) ×1
PLATFORM ARRAY DZAP LEADPOINT (MISCELLANEOUS) IMPLANT
PLATFORM BILATERAL KIT (MISCELLANEOUS) ×1 IMPLANT
SET SINGLE IT STRL (KITS) ×1 IMPLANT
SPONGE SURGIFOAM ABS GEL SZ50 (HEMOSTASIS) IMPLANT
STAPLER SKIN PROX WIDE 3.9 (STAPLE) ×3 IMPLANT
SUT ETHILON 3 0 PS 1 (SUTURE) IMPLANT
SUT SILK 2 0 TIES 10X30 (SUTURE) ×2 IMPLANT
SUT VIC AB 2-0 CP2 18 (SUTURE) ×3 IMPLANT
SYR CONTROL 10ML LL (SYRINGE) ×1 IMPLANT
TOWEL GREEN STERILE (TOWEL DISPOSABLE) ×2 IMPLANT
TOWEL GREEN STERILE FF (TOWEL DISPOSABLE) ×2 IMPLANT
TRAY FOLEY MTR SLVR 16FR STAT (SET/KITS/TRAYS/PACK) IMPLANT
WATER STERILE IRR 1000ML POUR (IV SOLUTION) ×2 IMPLANT

## 2019-11-24 NOTE — Progress Notes (Signed)
Awake, alert, conversant.  MAEW.  Speech clear and fluent.  Doing well.

## 2019-11-24 NOTE — Op Note (Signed)
Preoperative diagnosis:  Parkinsons disease Postoperative diagnosis:  same Surgeon:  [name] Neurologist:  Lurena Joiner Irais Mottram  Length of time for recording and macrostim: 2 hours Indication for procedure: Determination of optimal electrode position for deep brain stimulation therapy. Complications: None   Description of procedure:  Following the incision and burr hole placement, a recording microelectrode was slowly advanced into the brain via a motorized drive.  Beginning approximately 10 mm above the target, recordings were periodically made, and the resultant brain activity was described on the neurophysiologic recording worksheet.  Relevant samples of the recordings were saved for subsequent off line analysis.  A total of one recording pass was obtained on the left side of the brain.    7 mm of STN  were recorded from the left side of the brain. The sensory-motor subregion of the STN was identified. Following the recording, macrostimulation was done through the recording electrode with improvement in symptoms.  Then, the recording electrode was replaced by a stimulating electrode by Dr. Venetia Maxon.  Test stimulations were then again obtained.  Active contacts that were used and the response to stimulation, including both adverse effects and therapeutic effects, were recorded on the neurophysiologic stimulation worksheet.  In brief, there was complete resolution of tremor, rigidity, and bradykinesia at therapeutic voltages.   Adverse effects only occurred at supratherapeutic voltages and consisted of a sensation of abdominal muscle pulling.  We moved on to the contralateral side and one recording pass was obtained.  4 mm of STN were recorded from the right  side of the brain.  The sensory-motor subregion was identified.  Response to passive stimulation were noted.  Macrostimulation was performed through the recording electrode with good response.  The recording electrode was again replaced by a stimulating  electrode and test stimulations were obtained.  Again, there was resolution of Parkinsonian sx's of  rigidity and bradykinesia were noted at therapeutic voltages.   At supratherapeutic voltages, some side effects were noted such as sweating, diplopia (medial side effects).  Final electrode position was determined and the electrode was secured in place by Dr. Venetia Maxon on each side.  Kerin Salen, DO [facility] Director of Movement Disorders

## 2019-11-24 NOTE — Evaluation (Signed)
Physical Therapy Evaluation Patient Details Name: [name] [name] MRN: [id number] DOB: November 29, 1960 Today's Date: 11/24/2019   History of Present Illness  Patient is a 59 y/o male who presents s/p bilateral deep brain stimulator placement 3/23. PMH includes PDs, HTN, HLD, AAA, impaired vision.  Clinical Impression  Patient presents with right sided tremor, flat affect, blurry vision, generalized LE weakness and impaired mobility s/p above. 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 Reports his daughter will be able to assist at d/c. Today, pt requires Min A to stand from EOB and supervision-Min guard for ambulation. Noted to have 2/4 DOE but VSS on RA. Reports feeling less rigid in LEs/feet since surgery. Will need to negotiate stairs prior to discharge. Deferred today as pt still sleepy from surgery. Will follow acutely to maximize independence and mobility prior to return home.     Follow Up Recommendations No PT follow up;Supervision - Intermittent    Equipment Recommendations  None recommended by PT    Recommendations for Other Services       Precautions / Restrictions Precautions Precautions: None Precaution Comments: blurry vision- baseline Restrictions Weight Bearing Restrictions: No      Mobility  Bed Mobility Overal bed mobility: Needs Assistance Bed Mobility: Supine to Sit     Supine to sit: Supervision;HOB elevated     General bed mobility comments: Use of rails to get to EOB. No dizziness.  Transfers Overall transfer level: Needs assistance Equipment used: None Transfers: Sit to/from Stand Sit to Stand: Min assist         General transfer comment: Assist through hips to power to standing. Stood from [employer].  Ambulation/Gait Ambulation/Gait assistance: Supervision Gait Distance (Feet): 250 Feet Assistive device: None Gait Pattern/deviations: Step-through pattern;Decreased stride length Gait velocity: decreased Gait velocity  interpretation: 1.31 - 2.62 ft/sec, indicative of limited community ambulator General Gait Details: Slow, steady gait. 2/4 DOE but VSS on RA. Reports less stiffness in LEs and feet.  Stairs            Wheelchair Mobility    Modified Rankin (Stroke Patients Only)       Balance Overall balance assessment: Mild deficits observed, not formally tested                                           Pertinent Vitals/Pain Pain Assessment: Faces Faces Pain Scale: Hurts a little bit Pain Location: head Pain Descriptors / Indicators: Sore;Operative site guarding Pain Intervention(s): Repositioned;Monitored during session    [place] expects to be discharged to:: Private residence Living Arrangements: Alone Available Help at Discharge: Family;Available PRN/intermittently(daughter) Type of Home: Apartment Home Access: Stairs to enter   Entrance Stairs-Number of Steps: 1 flight Home Layout: One level Home Equipment: None      Prior Function Level of Independence: Independent         Comments: 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     Hand Dominance   Dominant Hand: Right    Extremity/Trunk Assessment   Upper Extremity Assessment Upper Extremity Assessment: Defer to OT evaluation    Lower Extremity Assessment Lower Extremity Assessment: RLE deficits/detail RLE Deficits / Details: Resting tremor noted in right foot       Communication   Communication: Other (comment)(low toned)  Cognition Arousal/Alertness: Awake/alert Behavior During Therapy: WFL for tasks assessed/performed Overall Cognitive Status: Within  Functional Limits for tasks assessed                                        General Comments General comments (skin integrity, edema, etc.): Incision and dressing on head clean, dry and intact.    Exercises     Assessment/Plan    PT Assessment Patient needs continued PT services  PT Problem  List Decreased mobility;Decreased balance;Decreased skin integrity;Decreased coordination       PT Treatment Interventions Therapeutic activities;Stair training;Balance training;Patient/family education;Gait training    PT Goals (Current goals can be found in the Care Plan section)  Acute Rehab PT Goals Patient Stated Goal: to turn on my deep brain stimulator device and take less meds PT Goal Formulation: With patient Time For Goal Achievement: 12/08/19 Potential to Achieve Goals: Good    Frequency Min 3X/week   Barriers to discharge Inaccessible home environment stairs    Co-evaluation               AM-PAC PT "6 Clicks" Mobility  Outcome Measure Help needed turning from your back to your side while in a flat bed without using bedrails?: None Help needed moving from lying on your back to sitting on the side of a flat bed without using bedrails?: A Little Help needed moving to and from a bed to a chair (including a wheelchair)?: A Little Help needed standing up from a chair using your arms (e.g., wheelchair or bedside chair)?: A Little Help needed to walk in hospital room?: None Help needed climbing 3-5 steps with a railing? : A Little 6 Click Score: 20    End of Session   Activity Tolerance: Patient tolerated treatment well Patient left: in bed;with call bell/phone within reach;with nursing/sitter in room(sitting EOB with nursing in room) Nurse Communication: Mobility status PT Visit Diagnosis: Difficulty in walking, not elsewhere classified (R26.2)    Time: 3614-4315 PT Time Calculation (min) (ACUTE ONLY): 23 min   Charges:   PT Evaluation $PT Eval Moderate Complexity: 1 Mod PT Treatments $Gait Training: 8-22 mins        Vale Haven, PT, DPT Acute Rehabilitation Services Pager [phone] Office [phone]      Blake Divine A Lanier Ensign 11/24/2019, 2:59 PM

## 2019-11-24 NOTE — Op Note (Signed)
11/24/2019  11:49 AM  PATIENT:  [name] [name]  59 y.o. male  PRE-OPERATIVE DIAGNOSIS:  Parkinsons  POST-OPERATIVE DIAGNOSIS:  Parkinsons  PROCEDURE:  Procedure(s): Bilateral deep brain stimulator placement (Bilateral) STN with microelectrode recording  SURGEON:  Surgeon(s) and Role:    [name], MD - Primary  PHYSICIAN ASSISTANT:   ASSISTANTS: Poteat, RN   ANESTHESIA:   local and IV sedation  EBL:  100 mL   BLOOD ADMINISTERED:none  DRAINS: none   LOCAL MEDICATIONS USED:  MARCAINE    and LIDOCAINE   SPECIMEN:  No Specimen  DISPOSITION OF SPECIMEN:  N/A  COUNTS:  YES  TOURNIQUET:  * No tourniquets in log *  DICTATION: DICTATION:  Indications: Patient is a 59 year old man with Parkinson's Disease. It was elected to take him to surgery for bilateral STN  deep brain stimulator electrode placement  Procedure: Preoperative planing was performed with volumetricCT and placement of 4 fiducial markers in the skull followed by CT of the brain also obtained volumetrically. These were then exported to create Starfix head frame with planned targeting of STN nucleus electrodes was performed. The patient was brought to the operating room and placed in a semi-Fowler's position with his neck and stabilized in a neck holder. This was affixed to the [place] adapter. Her scalp was then prepped with betadine scrub and DuraPrep and subsequently draped with an Ioban drape.with the fiducials and the skin was infiltrated with local lidocaine.  The areas of planned incision were then infiltrated with local lidocaine with epinephrine. The stepoffs were connected and the Starfix frame was assembled.  The entry points were then marked.  Two curvilinear incisions were made over the bilateral entry points. An elevator was used to clear pericranium from the skull. The perforator it was then used to produce two 14 mm bur holes. The dura was coagulated with bipolar electrocautery. Initially we operated  on the left and subsequently on the right. After opening the dura and a localizing the entry point the stylette and outer cannula were inserted into the brain. Surgifoam was placed to prevent CSF leakage at each entry site. Microelectrode recordings were then performed and  we had very good recordings from the STN. Subsequently the stimulating electrode was placed and the patient had significant improvement in tremor and rigidity on the right side of the body without significant side effects to higher voltages. The electrode was then locked into position with the [facility]. The redundant electrode was circularized and tunneled under the scalp. The assembly was reassembled on the right.  There was good microelectrode recordings for 6 mm of STN and after placing the stimulating electrode the patient had excellent control of tremor and rigidityon the left side of his body.  We therefore elected to place this electrode and locked into position and the stereotactic assembly was disassembled. The electrode was tunneled to the left and then into the posterior scalp and locked into position. Both electrodes were tunneled to the left for placement of a single left-sided IPG at a later date.  The wounds were then irrigated and closed with 2-0 Vicryl sutures and staples. The fiducials were removed and staples were placed over each of these sites. The head was washed and then sterile occlusive dressings were placed. The patient was taken to recovery having tolerated her procedure without difficulty or untoward effect. Please refer to detailed microelectrode recordings from Dr. Arbutus Leas for more specifics of the positioning of the electrodes. These are included in her Epic  note from the detailed neural monitoring and physical exam assessment during the surgery.  PLAN OF CARE: Admit to inpatient   PATIENT DISPOSITION:  PACU - hemodynamically stable.   Delay start of Pharmacological VTE agent (>24hrs) due to surgical  blood loss or risk of bleeding: yes  

## 2019-11-24 NOTE — Brief Op Note (Signed)
11/24/2019  11:49 AM  PATIENT:  [name] [name]  59 y.o. male  PRE-OPERATIVE DIAGNOSIS:  Parkinsons  POST-OPERATIVE DIAGNOSIS:  Parkinsons  PROCEDURE:  Procedure(s): Bilateral deep brain stimulator placement (Bilateral) STN with microelectrode recording  SURGEON:  Surgeon(s) and Role:    [name], MD - Primary  PHYSICIAN ASSISTANT:   ASSISTANTS: Poteat, RN   ANESTHESIA:   local and IV sedation  EBL:  100 mL   BLOOD ADMINISTERED:none  DRAINS: none   LOCAL MEDICATIONS USED:  MARCAINE    and LIDOCAINE   SPECIMEN:  No Specimen  DISPOSITION OF SPECIMEN:  N/A  COUNTS:  YES  TOURNIQUET:  * No tourniquets in log *  DICTATION: DICTATION:  Indications: Patient is a 59 year old man with Parkinson's Disease. It was elected to take him to surgery for bilateral STN  deep brain stimulator electrode placement  Procedure: Preoperative planing was performed with volumetricCT and placement of 4 fiducial markers in the skull followed by CT of the brain also obtained volumetrically. These were then exported to create Starfix head frame with planned targeting of STN nucleus electrodes was performed. The patient was brought to the operating room and placed in a semi-Fowler's position with his neck and stabilized in a neck holder. This was affixed to the [place] adapter. Her scalp was then prepped with betadine scrub and DuraPrep and subsequently draped with an Ioban drape.with the fiducials and the skin was infiltrated with local lidocaine.  The areas of planned incision were then infiltrated with local lidocaine with epinephrine. The stepoffs were connected and the Starfix frame was assembled.  The entry points were then marked.  Two curvilinear incisions were made over the bilateral entry points. An elevator was used to clear pericranium from the skull. The perforator it was then used to produce two 14 mm bur holes. The dura was coagulated with bipolar electrocautery. Initially we operated  on the left and subsequently on the right. After opening the dura and a localizing the entry point the stylette and outer cannula were inserted into the brain. Surgifoam was placed to prevent CSF leakage at each entry site. Microelectrode recordings were then performed and  we had very good recordings from the STN. Subsequently the stimulating electrode was placed and the patient had significant improvement in tremor and rigidity on the right side of the body without significant side effects to higher voltages. The electrode was then locked into position with the [facility]. The redundant electrode was circularized and tunneled under the scalp. The assembly was reassembled on the right.  There was good microelectrode recordings for 6 mm of STN and after placing the stimulating electrode the patient had excellent control of tremor and rigidityon the left side of his body.  We therefore elected to place this electrode and locked into position and the stereotactic assembly was disassembled. The electrode was tunneled to the left and then into the posterior scalp and locked into position. Both electrodes were tunneled to the left for placement of a single left-sided IPG at a later date.  The wounds were then irrigated and closed with 2-0 Vicryl sutures and staples. The fiducials were removed and staples were placed over each of these sites. The head was washed and then sterile occlusive dressings were placed. The patient was taken to recovery having tolerated her procedure without difficulty or untoward effect. Please refer to detailed microelectrode recordings from Dr. Arbutus Leas for more specifics of the positioning of the electrodes. These are included in her Epic  note from the detailed neural monitoring and physical exam assessment during the surgery.  PLAN OF CARE: Admit to inpatient   PATIENT DISPOSITION:  PACU - hemodynamically stable.   Delay start of Pharmacological VTE agent (>24hrs) due to surgical  blood loss or risk of bleeding: yes

## 2019-11-24 NOTE — Transfer of Care (Signed)
Immediate Anesthesia Transfer of Care Note  Patient: [name] [name]  Procedure(s) Performed: Bilateral deep brain stimulator placement (Bilateral Head)  Patient Location: PACU  Anesthesia Type:MAC  Level of Consciousness: awake, alert  and oriented  Airway & Oxygen Therapy: Patient Spontanous Breathing and Patient connected to nasal cannula oxygen  Post-op Assessment: Report given to RN, Post -op Vital signs reviewed and stable and Patient moving all extremities X 4  Post vital signs: Reviewed and stable  Last Vitals:  Vitals Value Taken Time  BP 120/59 11/24/19 1204  Temp    Pulse 63 11/24/19 1203  Resp 14 11/24/19 1203  SpO2 97 % 11/24/19 1203  Vitals shown include unvalidated device data.  Last Pain:  Vitals:   11/24/19 0637  TempSrc:   PainSc: 0-No pain      Patients Stated Pain Goal: 3 (12/75/17 0017)  Complications: No apparent anesthesia complications

## 2019-11-24 NOTE — Anesthesia Postprocedure Evaluation (Signed)
Anesthesia Post Note  Patient: [name] [name]  Procedure(s) Performed: Bilateral deep brain stimulator placement (Bilateral Head)     Patient location during evaluation: PACU Anesthesia Type: MAC Level of consciousness: awake and alert Pain management: pain level controlled Vital Signs Assessment: post-procedure vital signs reviewed and stable Respiratory status: spontaneous breathing, nonlabored ventilation, respiratory function stable and patient connected to nasal cannula oxygen Cardiovascular status: stable and blood pressure returned to baseline Postop Assessment: no apparent nausea or vomiting Anesthetic complications: no    Last Vitals:  Vitals:   11/24/19 1353 11/24/19 1422  BP: 118/74 137/82  Pulse: (!) 59 61  Resp: 13 17  Temp: 36.7 C 37 C  SpO2: 95% 100%    Last Pain:  Vitals:   11/24/19 1422  TempSrc: Oral  PainSc:                  Tiajuana Amass

## 2019-11-24 NOTE — Interval H&P Note (Signed)
History and Physical Interval Note:  11/24/2019 7:26 AM  [name]  has presented today for surgery, with the diagnosis of Parkinsons.  The various methods of treatment have been discussed with the patient and family. After consideration of risks, benefits and other options for treatment, the patient has consented to  Procedure(s) with comments: Bilateral deep brain stimulator placement (Bilateral) - Bilateral deep brain stimulator placement as a surgical intervention.  The patient's history has been reviewed, patient examined, no change in status, stable for surgery.  I have reviewed the patient's chart and labs.  Questions were answered to the patient's satisfaction.     Dorian Heckle

## 2019-11-25 ENCOUNTER — Encounter: Payer: Self-pay | Admitting: *Deleted

## 2019-11-25 ENCOUNTER — Inpatient Hospital Stay (HOSPITAL_COMMUNITY): Payer: BC Managed Care – PPO

## 2019-11-25 MED ORDER — HYDROCODONE-ACETAMINOPHEN 5-325 MG PO TABS: 1.0000 | ORAL_TABLET | ORAL | 0 refills | Status: DC | PRN

## 2019-11-25 NOTE — Discharge Summary (Signed)
Physician Discharge Summary  Patient ID: [name] [name] MRN: [id number] DOB/AGE: 01-08-1961 59 y.o.  Admit date: 11/24/2019 Discharge date: 11/25/2019  Admission Diagnoses:Parkinsons    Discharge Diagnoses: Parkinsons s/p Bilateral deep brain stimulator placement (Bilateral) STN with microelectrode recording     Active Problems:   Parkinson's disease [name]   Discharged Condition: good  Hospital Course: [name] [name] was admitted for surgery as above. Following uncomplicated bilateral deep brain stimulator placement, he recovered nicely and transferred to [facility] for nursing care. He is mobilizing well.  Consults: neurology  Significant Diagnostic Studies: radiology: CT scan: post-op CT  Treatments: surgery: Bilateral deep brain stimulator placement (Bilateral) STN with microelectrode recording    Discharge Exam: Blood pressure (!) 148/46, pulse 73, temperature 98.4 F (36.9 C), temperature source Oral, resp. rate 17, height 6\' 1"  (1.854 m), weight (!) 152 kg, SpO2 93 %. Alert, conversant with fluent speech. Reports no headache. MAEW, has ambulated. PEARL. Incisions without erythema drainage or swelling beneath honeycomb, bacitracin, and staples.    Disposition:  There are no questions and answers to display.  Discharge to home.  May remove dressings today or tomorrow. Ok to shower and cleanse incisions gently. Keep clean and dry at other times. Ok to leave open to air. Third stage next week for placement of pulse generator and extensions. He will complete his course of Keflex Rx'ed last week. He has Norco 5/325 at home for prn use.      Discharge Instructions    Diet - low sodium heart healthy   Complete by: As directed    Increase activity slowly   Complete by: As directed      Allergies as of 11/25/2019   No Known Allergies     Medication [name]    TAKE these medications   atorvastatin 40 MG tablet Commonly known as: LIPITOR Take 40  mg by mouth daily.   carbidopa-levodopa 25-100 MG tablet Commonly known as: SINEMET IR 3 TABLETS BY MOUTH AT 6AM 2 TABLETS AT 9AM 2 TABLETS AT 1 PM 1 TABLETS AT 4PM AND 1 TABLET AT 7PM What changed:   how much to take  how to take this  when to take this  additional instructions   cephALEXin 500 MG capsule Commonly known as: KEFLEX Take 1 capsule (500 mg total) by mouth 3 (three) times daily for 10 days.   clonazePAM 0.5 MG tablet Commonly known as: KLONOPIN Take 0.5 tablets (0.25 mg total) by mouth at bedtime. What changed:   when to take this  reasons to take this   furosemide 20 MG tablet Commonly known as: LASIX Take 20 mg by mouth daily.   hydrochlorothiazide 12.5 MG tablet Commonly known as: HYDRODIURIL Take 12.5 mg by mouth daily.   HYDROcodone-acetaminophen 5-325 MG tablet Commonly known as: NORCO/VICODIN Take 1 tablet by mouth every 6 (six) hours as needed for moderate pain. What changed: Another medication with the same name was added. Make sure you understand how and when to take each.   HYDROcodone-acetaminophen 5-325 MG tablet Commonly known as: NORCO/VICODIN Take 1-2 tablets by mouth every 4 (four) hours as needed for severe pain ((score 7 to 10)). What changed: You were already taking a medication with the same name, and this prescription was added. Make sure you understand how and when to take each.   PARoxetine 10 MG tablet Commonly known as: PAXIL TAKE ONE TABLET BY MOUTH DAILY   rOPINIRole 2 MG tablet Commonly known as: Requip 2 in the morning, 1 in the  afternoon, 1 in the evening What changed:   how much to take  how to take this  when to take this  additional instructions   sildenafil 100 MG tablet Commonly known as: VIAGRA Take 100 mg by mouth as needed for erectile dysfunction.   SYSTANE OP Place 1 drop into both eyes daily as needed (dry eyes).   tamsulosin 0.4 MG Caps capsule Commonly known as: FLOMAX Take 0.4 mg by  mouth daily after breakfast.   terbinafine 1 % cream Commonly known as: LAMISIL Apply 1 application topically daily as needed (ringworm).        Signed: Peggyann Shoals, MD 11/25/2019, 8:28 AM

## 2019-11-25 NOTE — Discharge Instructions (Signed)
Wound Care Leave incision open to air. You may shower. Do not scrub directly on incision.  Do not put any creams, lotions, or ointments on incision. Activity Walk each and every day, increasing distance each day.  Diet Resume your normal diet.  Return to Work Will be discussed at you follow up appointment. Call Your Doctor If Any of These Occur Redness, drainage, or swelling at the wound.  Temperature greater than 101 degrees. Severe pain not relieved by pain medication. Incision starts to come apart. Follow Up Appt Call for any problems [phone]

## 2019-11-25 NOTE — H&P (View-Only) (Signed)
Subjective: Patient reports "I'm doing ok"  Objective: Vital signs in last 24 hours: Temp:  [97.8 F (36.6 C)-99 F (37.2 C)] 98.2 F (36.8 C) (03/24 0401) Pulse Rate:  [59-79] 73 (03/24 0401) Resp:  [12-20] 20 (03/24 0401) BP: (111-138)/(58-82) 138/75 (03/24 0401) SpO2:  [93 %-100 %] 97 % (03/24 0401)  Intake/Output from previous day: 03/23 0701 - 03/24 0700 In: 920 [I.V.:900; IV Piggyback:20] Out: 400 [Urine:300; Blood:100] Intake/Output this shift: No intake/output data recorded.  Alert, conversant with fluent speech. Reports no headache. MAEW, has ambulated. PEARL. Incisions without erythema drainage or swelling beneath honeycomb, bacitracin, and staples.   Lab Results: Recent Labs    11/24/19 0606  WBC 6.7  HGB 14.0  HCT 42.3  PLT 215   BMET Recent Labs    11/24/19 0606  NA 140  K 3.7  CL 103  CO2 27  GLUCOSE 86  BUN 15  CREATININE 1.11  CALCIUM 9.1    Studies/Results: CT DBS HEAD W/O CONTRAST  Result Date: 11/25/2019 CLINICAL DATA:  Postoperative state EXAM: CT HEAD WITHOUT CONTRAST TECHNIQUE: Contiguous axial images were obtained from the base of the skull through the vertex without intravenous contrast. COMPARISON:  11/17/2019 FINDINGS: Brain: Bilateral deep brain stimulator placement. No postoperative hemorrhage or swelling. There is expected pneumocephalus. No complicating infarct or extra-axial collection. Vascular: No hyperdense vessel or unexpected calcification. Skull: Unremarkable bilateral burr hole. Sinuses/Orbits: Negative IMPRESSION: No unexpected finding after deep brain stimulator placement. Electronically Signed   By: Marnee Spring M.D.   On: 11/25/2019 06:30    Assessment/Plan: Doing well  LOS: 1 day  Ok to d/c to home per Dr. Venetia Maxon. May remove dressings today or tomorrow. Ok to shower and cleanse incisions gently. Keep clean and dry at other times. Ok to leave open to air.  Third stage next week for placement of pulse generator and  extensions.   Georgiann Cocker 11/25/2019, 7:34 AM  Patient able to twist side to side and move his right foot without cramping pain for the first time in three years.  Discharge home.

## 2019-11-25 NOTE — Progress Notes (Signed)
Physical Therapy Treatment Patient Details Name: [name] [name] MRN: [id number] DOB: September 03, 1961 Today's Date: 11/25/2019    History of Present Illness Patient is a 59 y/o male who presents s/p bilateral deep brain stimulator placement 3/23. PMH includes PDs, HTN, HLD, AAA, impaired vision.    PT Comments    Pt making steady progress with functional mobility as indicated by decreased need for physical assistance. He also participated in stair training this session without difficulties. Plan is to d/c home today with daughter's support. Pt is ready to d/c from PT perspective.     Follow Up Recommendations  No PT follow up;Supervision - Intermittent     Equipment Recommendations  None recommended by PT    Recommendations for Other Services       Precautions / Restrictions Precautions Precautions: None Restrictions Weight Bearing Restrictions: No    Mobility  Bed Mobility               General bed mobility comments: pt OOB in recliner chair upon arrival  Transfers Overall transfer level: Needs assistance Equipment used: None Transfers: Sit to/from Stand Sit to Stand: Supervision         General transfer comment: supervision for safety, no physical assistance needed  Ambulation/Gait Ambulation/Gait assistance: Supervision Gait Distance (Feet): 300 Feet Assistive device: None Gait Pattern/deviations: Step-through pattern;Decreased stride length Gait velocity: decreased   General Gait Details: pt with slow, steady gait; no LOB or need for physical assistance, supervision for safety to ambulate in hallway   Stairs Stairs: Yes Stairs assistance: Min guard Stair Management: One rail Right;Alternating pattern;Forwards Number of Stairs: 10 General stair comments: no LOB or need for physical assistance, min guard for safety   Wheelchair Mobility    Modified Rankin (Stroke Patients Only)       Balance Overall balance assessment: Mild deficits observed,  not formally tested                                          Cognition Arousal/Alertness: Awake/alert Behavior During Therapy: WFL for tasks assessed/performed Overall Cognitive Status: Within Functional Limits for tasks assessed                                        Exercises      General Comments        Pertinent Vitals/Pain Pain Assessment: No/denies pain    Home Living                      Prior Function            PT Goals (current goals can now be found in the care plan section) Acute Rehab PT Goals PT Goal Formulation: With patient Time For Goal Achievement: 12/08/19 Potential to Achieve Goals: Good Progress towards PT goals: Progressing toward goals    Frequency    Min 3X/week      PT Plan Current plan remains appropriate    Co-evaluation              AM-PAC PT "6 Clicks" Mobility   Outcome Measure  Help needed turning from your back to your side while in a flat bed without using bedrails?: None Help needed moving from lying on your back to sitting on the side of a flat bed  without using bedrails?: None Help needed moving to and from a bed to a chair (including a wheelchair)?: None Help needed standing up from a chair using your arms (e.g., wheelchair or bedside chair)?: None Help needed to walk in hospital room?: None Help needed climbing 3-5 steps with a railing? : None 6 Click Score: 24    End of Session   Activity Tolerance: Patient tolerated treatment well Patient left: in chair;with call bell/phone within reach Nurse Communication: Mobility status PT Visit Diagnosis: Difficulty in walking, not elsewhere classified (R26.2)     Time: 6213-0865 PT Time Calculation (min) (ACUTE ONLY): 10 min  Charges:  $Gait Training: 8-22 mins                     Anastasio Champion, DPT  Acute Rehabilitation Services Pager [phone] Office [place] 11/25/2019, 10:00  AM

## 2019-11-25 NOTE — Progress Notes (Addendum)
Subjective: Patient reports "I'm doing ok"  Objective: Vital signs in last 24 hours: Temp:  [97.8 F (36.6 C)-99 F (37.2 C)] 98.2 F (36.8 C) (03/24 0401) Pulse Rate:  [59-79] 73 (03/24 0401) Resp:  [12-20] 20 (03/24 0401) BP: (111-138)/(58-82) 138/75 (03/24 0401) SpO2:  [93 %-100 %] 97 % (03/24 0401)  Intake/Output from previous day: 03/23 0701 - 03/24 0700 In: 920 [I.V.:900; IV Piggyback:20] Out: 400 [Urine:300; Blood:100] Intake/Output this shift: No intake/output data recorded.  Alert, conversant with fluent speech. Reports no headache. MAEW, has ambulated. PEARL. Incisions without erythema drainage or swelling beneath honeycomb, bacitracin, and staples.   Lab Results: Recent Labs    11/24/19 0606  WBC 6.7  HGB 14.0  HCT 42.3  PLT 215   BMET Recent Labs    11/24/19 0606  NA 140  K 3.7  CL 103  CO2 27  GLUCOSE 86  BUN 15  CREATININE 1.11  CALCIUM 9.1    Studies/Results: CT DBS HEAD W/O CONTRAST  Result Date: 11/25/2019 CLINICAL DATA:  Postoperative state EXAM: CT HEAD WITHOUT CONTRAST TECHNIQUE: Contiguous axial images were obtained from the base of the skull through the vertex without intravenous contrast. COMPARISON:  11/17/2019 FINDINGS: Brain: Bilateral deep brain stimulator placement. No postoperative hemorrhage or swelling. There is expected pneumocephalus. No complicating infarct or extra-axial collection. Vascular: No hyperdense vessel or unexpected calcification. Skull: Unremarkable bilateral burr hole. Sinuses/Orbits: Negative IMPRESSION: No unexpected finding after deep brain stimulator placement. Electronically Signed   By: [name]  [name] M.D.   On: 11/25/2019 06:30    Assessment/Plan: Doing well  LOS: 1 day  Ok to d/c to home per Dr. Antonette [name]. May remove dressings today or tomorrow. Ok to shower and cleanse incisions gently. Keep clean and dry at other times. Ok to leave open to air.  Third stage next week for placement of pulse generator and  extensions.   [name], [name] 11/25/2019, 7:34 AM  Patient able to twist side to side and move his right foot without cramping pain for the first time in three years.  Discharge home.  

## 2019-11-25 NOTE — Evaluation (Signed)
Occupational Therapy Evaluation Patient Details Name: [name] [name] MRN: [id number] DOB: May 05, 1961 Today's Date: 11/25/2019    History of Present Illness Patient is a 59 y/o male who presents s/p bilateral deep brain stimulator placement 3/23. PMH includes PDs, HTN, HLD, AAA, impaired vision.   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 Pt currently performing ADL with minguardA overall for stability in standing. Pt modified independence for UB ADL. Pt reports that he feels slightly better since sx. Pt education for reacher use at home for LB ADL.  All education complete. Pt feels confident enough to return home. Pt does not require continued OT skilled services. OT signing off.     Follow Up Recommendations  No OT follow up;Supervision - Intermittent    Equipment Recommendations  None recommended by OT    Recommendations for Other Services       Precautions / Restrictions Precautions Precautions: None Restrictions Weight Bearing Restrictions: No      Mobility Bed Mobility               General bed mobility comments: pt OOB in recliner chair upon arrival  Transfers Overall transfer level: Needs assistance Equipment used: None Transfers: Sit to/from Stand Sit to Stand: Supervision         General transfer comment: supervision for safety, no physical assistance needed    Balance Overall balance assessment: Mild deficits observed, not formally tested                                         ADL either performed or assessed with clinical judgement   ADL Overall ADL's : Needs assistance/impaired Eating/Feeding: Modified independent;Sitting   Grooming: Modified independent;Standing   Upper Body Bathing: Modified independent;Standing   Lower Body Bathing: Min guard;Sitting/lateral leans;Sit to/from stand   Upper Body  Dressing : Modified independent;Standing   Lower Body Dressing: Min guard;Sitting/lateral leans;Sit to/from stand;Cueing for safety   Toilet Transfer: Modified Independent;Ambulation   Toileting- Clothing Manipulation and Hygiene: Modified independent;Sitting/lateral lean;Sit to/from stand       Functional mobility during ADLs: Supervision/safety General ADL Comments: Pt donning own clothes and lifting leg into pants in standing; pt advised to sit down, but pt leaning on wall to perform task. Pt given advice to perform LB dressing with figure 4 technique or using a reacher to increase independence.     Vision Baseline Vision/History: Wears glasses(blurry vision pt stating "I can see fine.") Wears Glasses: At all times Patient Visual Report: No change from baseline Vision Assessment?: Yes Eye Alignment: Within Functional Limits Ocular Range of Motion: Within Functional Limits Alignment/Gaze Preference: Within Defined Limits Tracking/Visual Pursuits: Able to track stimulus in all quads without difficulty Additional Comments: Pt wear glasses to correct blurriness     Perception     Praxis      Pertinent Vitals/Pain Pain Assessment: No/denies pain     Hand Dominance Right   Extremity/Trunk Assessment Upper Extremity Assessment Upper Extremity Assessment: Overall WFL for tasks assessed   Lower Extremity Assessment Lower Extremity Assessment: Generalized weakness RLE Deficits / Details: Resting tremor noted in right foot   Cervical / Trunk Assessment Cervical / Trunk Assessment: Normal   Communication Communication Communication: No difficulties   Cognition Arousal/Alertness: Awake/alert Behavior During Therapy: WFL for tasks assessed/performed Overall Cognitive  Status: Within Functional Limits for tasks assessed                                     General Comments  Pt reports to have assist x1-1.5 weeks from daughter.    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        OT Problem List: Decreased activity tolerance      OT Treatment/Interventions:      OT Goals(Current goals can be found in the care plan section) Acute Rehab OT Goals Patient Stated Goal: to turn on my deep brain stimulator device and take less meds OT Goal Formulation: With patient  OT Frequency:     Barriers to D/C:            Co-evaluation              AM-PAC OT "6 Clicks" Daily Activity     Outcome Measure Help from another person eating meals?: None Help from another person taking care of personal grooming?: None Help from another person toileting, which includes using toliet, bedpan, or urinal?: None Help from another person bathing (including washing, rinsing, drying)?: A Little Help from another person to put on and taking off regular upper body clothing?: None Help from another person to put on and taking off regular lower body clothing?: A Little 6 Click Score: 22   End of Session Nurse Communication: Mobility status  Activity Tolerance: Patient tolerated treatment well Patient left: in chair;with call bell/phone within reach  OT Visit Diagnosis: Muscle weakness (generalized) (M62.81)                Time: 3557-3220 OT Time Calculation (min): 17 min Charges:  OT General Charges $OT Visit: 1 Visit OT Evaluation $OT Eval Moderate Complexity: 1 Mod  [name] [name],  OTR/L Acute Rehabilitation Services Pager: [phone] Office: [phone]   [name] [name] C 11/25/2019, 1:10 PM

## 2019-11-25 NOTE — Plan of Care (Signed)
Patient alert and oriented, mae's well, voiding adequate amount of urine, swallowing without difficulty, no c/o pain at time of discharge. Patient discharged home with family. Script and discharged instructions given to patient. Patient and family stated understanding of instructions given. Patient has an appointment with Dr.Stern    

## 2019-11-28 ENCOUNTER — Other Ambulatory Visit (HOSPITAL_COMMUNITY)
Admission: RE | Admit: 2019-11-28 | Discharge: 2019-11-28 | Disposition: A | Discharge status: Home / Self Care | Payer: BC Managed Care – PPO | Source: Ambulatory Visit | Attending: Neurosurgery | Admitting: Neurosurgery

## 2019-11-28 DIAGNOSIS — Z01812 Encounter for preprocedural laboratory examination: Secondary | ICD-10-CM | POA: Insufficient documentation

## 2019-11-28 DIAGNOSIS — Z20822 Contact with and (suspected) exposure to covid-19: Secondary | ICD-10-CM | POA: Diagnosis not present

## 2019-11-28 LAB — SARS CORONAVIRUS 2 (TAT 6-24 HRS): SARS Coronavirus 2: NEGATIVE

## 2019-11-30 ENCOUNTER — Encounter (HOSPITAL_COMMUNITY): Payer: Self-pay | Admitting: Neurosurgery

## 2019-11-30 NOTE — Anesthesia Preprocedure Evaluation (Addendum)
Anesthesia Evaluation  Patient identified by MRN, date of birth, ID band Patient awake    Reviewed: Allergy & Precautions, NPO status , Patient's Chart, lab work & pertinent test results  History of Anesthesia Complications Negative for: history of anesthetic complications  Airway Mallampati: III  TM Distance: >3 FB Neck ROM: Full    Dental  (+) Teeth Intact, Dental Advisory Given   Pulmonary neg pulmonary ROS,    Pulmonary exam normal breath sounds clear to auscultation       Cardiovascular hypertension, Pt. on medications + Peripheral Vascular Disease (AAA)  Normal cardiovascular exam Rhythm:Regular Rate:Normal  10/20 Nuclear stress test: Impression: 1) The overall study quality is good with some limitations due to patient  size and some motion. Attenuation correction was not available. 2) SPECT Tc9m sestamibi stress myocardial perfusion imaging is normal.  There is no obvious evidence of regional myocardial ischemia or infarction. [facility] toolbox showed SDS=-1 No TID. There was normal wall thickening post stress. The LV ejection fraction was calculated post stress at 56% (normal >50%) which correlates with  Echocardiography  10/20 Echo: Dilated ascending aorta at 4.1 cm. There was insufficient TR detected to calculate RV systolic pressure. The right ventricle is normal in size and function. The left ventricle is normal in size, mild concentric left ventricular hypertrophy, the transmitral spectral Doppler flow pattern is suggestive of impaired LV relaxation, no obvious regional wall motion abnormalities noted, LVEF of 58%.   Neuro/Psych parkinsons negative psych ROS   GI/Hepatic negative GI ROS, Neg liver ROS,   Endo/Other  Hypothyroidism Morbid obesity  Renal/GU negative Renal ROS     Musculoskeletal   Abdominal   Peds  Hematology negative hematology ROS (+)   Anesthesia Other Findings    Reproductive/Obstetrics                            Anesthesia Physical  Anesthesia Plan  ASA: III  Anesthesia Plan: General   Post-op Pain Management:    Induction: Intravenous  PONV Risk Score and Plan: 2 and Ondansetron and Dexamethasone  Airway Management Planned: LMA  Additional Equipment:   Intra-op Plan:   Post-operative Plan: Extubation in OR  Informed Consent: I have reviewed the patients History and Physical, chart, labs and discussed the procedure including the risks, benefits and alternatives for the proposed anesthesia with the patient or authorized representative who has indicated his/her understanding and acceptance.     Dental advisory given  Plan Discussed with: CRNA  Anesthesia Plan Comments:       Anesthesia Quick Evaluation

## 2019-12-01 ENCOUNTER — Encounter (HOSPITAL_COMMUNITY): Payer: Self-pay | Admitting: Neurosurgery

## 2019-12-01 ENCOUNTER — Ambulatory Visit (HOSPITAL_COMMUNITY): Payer: BC Managed Care – PPO | Admitting: Certified Registered Nurse Anesthetist

## 2019-12-01 ENCOUNTER — Other Ambulatory Visit: Payer: Self-pay

## 2019-12-01 ENCOUNTER — Ambulatory Visit: Admit: 2019-12-01 | Payer: BC Managed Care – PPO | Admitting: Neurosurgery

## 2019-12-01 ENCOUNTER — Observation Stay (HOSPITAL_COMMUNITY)
Admission: RE | Admit: 2019-12-01 | Discharge: 2019-12-01 | Disposition: A | Discharge status: Home / Self Care | Payer: BC Managed Care – PPO | Attending: Neurosurgery | Admitting: Neurosurgery

## 2019-12-01 ENCOUNTER — Encounter (HOSPITAL_COMMUNITY)
Admission: RE | Disposition: A | Discharge status: Home / Self Care | Payer: Self-pay | Source: Home / Self Care | Attending: Neurosurgery

## 2019-12-01 DIAGNOSIS — G20 Parkinson's disease: Secondary | ICD-10-CM | POA: Diagnosis present

## 2019-12-01 DIAGNOSIS — I714 Abdominal aortic aneurysm, without rupture: Secondary | ICD-10-CM | POA: Diagnosis not present

## 2019-12-01 DIAGNOSIS — Z6841 Body Mass Index (BMI) 40.0 and over, adult: Secondary | ICD-10-CM | POA: Insufficient documentation

## 2019-12-01 DIAGNOSIS — I10 Essential (primary) hypertension: Secondary | ICD-10-CM | POA: Insufficient documentation

## 2019-12-01 DIAGNOSIS — G20A1 Parkinson's disease without dyskinesia, without mention of fluctuations: Secondary | ICD-10-CM | POA: Diagnosis present

## 2019-12-01 HISTORY — PX: PULSE GENERATOR IMPLANT: SHX5370

## 2019-12-01 SURGERY — BILATERAL PULSE GENERATOR IMPLANT
Anesthesia: General | Laterality: Bilateral

## 2019-12-01 MED ORDER — LIDOCAINE-EPINEPHRINE 1 %-1:100000 IJ SOLN
INTRAMUSCULAR | Status: DC | PRN
  Administered 2019-12-01: 9 mL

## 2019-12-01 MED ORDER — CARBIDOPA-LEVODOPA 25-100 MG PO TABS: 2.0000 | ORAL_TABLET | ORAL | Status: DC

## 2019-12-01 MED ORDER — TERBINAFINE HCL 1 % EX CREA: 1.0000 "application " | TOPICAL_CREAM | Freq: Every day | CUTANEOUS | Status: DC | PRN

## 2019-12-01 MED ORDER — ATORVASTATIN CALCIUM 40 MG PO TABS: 40.0000 mg | ORAL_TABLET | Freq: Every day | ORAL | Status: DC

## 2019-12-01 MED ORDER — HYDROMORPHONE HCL 1 MG/ML IJ SOLN: 0.5000 mg | INTRAMUSCULAR | Status: DC | PRN

## 2019-12-01 MED ORDER — PANTOPRAZOLE SODIUM 40 MG IV SOLR: 40.0000 mg | Freq: Every day | INTRAVENOUS | Status: DC

## 2019-12-01 MED ORDER — FLEET ENEMA 7-19 GM/118ML RE ENEM: 1.0000 | ENEMA | Freq: Once | RECTAL | Status: DC | PRN

## 2019-12-01 MED ORDER — OXYCODONE HCL 5 MG PO TABS: 5.0000 mg | ORAL_TABLET | ORAL | Status: DC | PRN

## 2019-12-01 MED ORDER — PROMETHAZINE HCL 25 MG/ML IJ SOLN: 6.2500 mg | INTRAMUSCULAR | Status: DC | PRN

## 2019-12-01 MED ORDER — PROPOFOL 10 MG/ML IV BOLUS
INTRAVENOUS | Status: DC | PRN
  Administered 2019-12-01: 200 mg via INTRAVENOUS

## 2019-12-01 MED ORDER — ONDANSETRON HCL 4 MG PO TABS: 4.0000 mg | ORAL_TABLET | Freq: Four times a day (QID) | ORAL | Status: DC | PRN

## 2019-12-01 MED ORDER — SODIUM CHLORIDE 0.9% FLUSH: 3.0000 mL | INTRAVENOUS | Status: DC | PRN

## 2019-12-01 MED ORDER — VANCOMYCIN HCL 1000 MG IV SOLR
INTRAVENOUS | Status: AC
  Filled 2019-12-01: qty 1000

## 2019-12-01 MED ORDER — ACETAMINOPHEN 500 MG PO TABS
1000.0000 mg | ORAL_TABLET | Freq: Once | ORAL | Status: AC
  Administered 2019-12-01: 07:00:00 500 mg via ORAL
  Filled 2019-12-01: qty 2

## 2019-12-01 MED ORDER — TAMSULOSIN HCL 0.4 MG PO CAPS: 0.4000 mg | ORAL_CAPSULE | Freq: Every day | ORAL | Status: DC

## 2019-12-01 MED ORDER — CARBIDOPA-LEVODOPA 25-100 MG PO TABS
1.0000 | ORAL_TABLET | ORAL | Status: DC
  Administered 2019-12-01 (×2): 1 via ORAL
  Filled 2019-12-01 (×3): qty 1

## 2019-12-01 MED ORDER — CHLORHEXIDINE GLUCONATE CLOTH 2 % EX PADS: 6.0000 | MEDICATED_PAD | Freq: Once | CUTANEOUS | Status: DC

## 2019-12-01 MED ORDER — CARBIDOPA-LEVODOPA 25-100 MG PO TABS
3.0000 | ORAL_TABLET | ORAL | Status: DC
  Filled 2019-12-01: qty 3

## 2019-12-01 MED ORDER — BISACODYL 10 MG RE SUPP: 10.0000 mg | Freq: Every day | RECTAL | Status: DC | PRN

## 2019-12-01 MED ORDER — HYDROCODONE-ACETAMINOPHEN 5-325 MG PO TABS: 1.0000 | ORAL_TABLET | ORAL | Status: DC | PRN

## 2019-12-01 MED ORDER — PROPOFOL 10 MG/ML IV BOLUS
INTRAVENOUS | Status: AC
  Filled 2019-12-01: qty 40

## 2019-12-01 MED ORDER — BUPIVACAINE HCL (PF) 0.5 % IJ SOLN
INTRAMUSCULAR | Status: AC
  Filled 2019-12-01: qty 30

## 2019-12-01 MED ORDER — CEFAZOLIN SODIUM-DEXTROSE 2-4 GM/100ML-% IV SOLN
2.0000 g | Freq: Three times a day (TID) | INTRAVENOUS | Status: DC
  Administered 2019-12-01: 2 g via INTRAVENOUS
  Filled 2019-12-01: qty 100

## 2019-12-01 MED ORDER — CLONAZEPAM 0.25 MG PO TBDP: 0.2500 mg | ORAL_TABLET | Freq: Every evening | ORAL | Status: DC | PRN

## 2019-12-01 MED ORDER — PHENYLEPHRINE 40 MCG/ML (10ML) SYRINGE FOR IV PUSH (FOR BLOOD PRESSURE SUPPORT): PREFILLED_SYRINGE | INTRAVENOUS | Status: DC | PRN

## 2019-12-01 MED ORDER — ONDANSETRON HCL 4 MG/2ML IJ SOLN
INTRAMUSCULAR | Status: AC
  Filled 2019-12-01: qty 2

## 2019-12-01 MED ORDER — ONDANSETRON HCL 4 MG/2ML IJ SOLN: 4.0000 mg | Freq: Four times a day (QID) | INTRAMUSCULAR | Status: DC | PRN

## 2019-12-01 MED ORDER — ACETAMINOPHEN 650 MG RE SUPP: 650.0000 mg | RECTAL | Status: DC | PRN

## 2019-12-01 MED ORDER — EPHEDRINE SULFATE-NACL 50-0.9 MG/10ML-% IV SOSY
PREFILLED_SYRINGE | INTRAVENOUS | Status: DC | PRN
  Administered 2019-12-01: 10 mg via INTRAVENOUS

## 2019-12-01 MED ORDER — DEXTROSE 5 % IV SOLN
3.0000 g | Freq: Once | INTRAVENOUS | Status: AC
  Administered 2019-12-01: 3 g via INTRAVENOUS
  Filled 2019-12-01: qty 3

## 2019-12-01 MED ORDER — LIDOCAINE 2% (20 MG/ML) 5 ML SYRINGE
INTRAMUSCULAR | Status: DC | PRN
  Administered 2019-12-01: 80 mg via INTRAVENOUS

## 2019-12-01 MED ORDER — HYDROXYZINE HCL 50 MG/ML IM SOLN: 50.0000 mg | Freq: Four times a day (QID) | INTRAMUSCULAR | Status: DC | PRN

## 2019-12-01 MED ORDER — ZOLPIDEM TARTRATE 5 MG PO TABS: 5.0000 mg | ORAL_TABLET | Freq: Every evening | ORAL | Status: DC | PRN

## 2019-12-01 MED ORDER — MENTHOL 3 MG MT LOZG: 1.0000 | LOZENGE | OROMUCOSAL | Status: DC | PRN

## 2019-12-01 MED ORDER — VANCOMYCIN HCL 1 G IV SOLR
INTRAVENOUS | Status: DC | PRN
  Administered 2019-12-01: 1000 mg via TOPICAL

## 2019-12-01 MED ORDER — BACITRACIN ZINC 500 UNIT/GM EX OINT
TOPICAL_OINTMENT | CUTANEOUS | Status: AC
  Filled 2019-12-01: qty 28.35

## 2019-12-01 MED ORDER — MIDAZOLAM HCL 2 MG/2ML IJ SOLN
INTRAMUSCULAR | Status: AC
  Filled 2019-12-01: qty 2

## 2019-12-01 MED ORDER — DEXAMETHASONE SODIUM PHOSPHATE 10 MG/ML IJ SOLN
INTRAMUSCULAR | Status: AC
  Filled 2019-12-01: qty 1

## 2019-12-01 MED ORDER — LIDOCAINE-EPINEPHRINE 1 %-1:100000 IJ SOLN
INTRAMUSCULAR | Status: AC
  Filled 2019-12-01: qty 1

## 2019-12-01 MED ORDER — ONDANSETRON HCL 4 MG/2ML IJ SOLN
INTRAMUSCULAR | Status: DC | PRN
  Administered 2019-12-01: 4 mg via INTRAVENOUS

## 2019-12-01 MED ORDER — DEXAMETHASONE SODIUM PHOSPHATE 10 MG/ML IJ SOLN
INTRAMUSCULAR | Status: DC | PRN
  Administered 2019-12-01: 10 mg via INTRAVENOUS

## 2019-12-01 MED ORDER — LACTATED RINGERS IV SOLN: INTRAVENOUS | Status: DC | PRN

## 2019-12-01 MED ORDER — BUPIVACAINE HCL (PF) 0.5 % IJ SOLN
INTRAMUSCULAR | Status: DC | PRN
  Administered 2019-12-01: 9 mL

## 2019-12-01 MED ORDER — ROCURONIUM BROMIDE 10 MG/ML (PF) SYRINGE
PREFILLED_SYRINGE | INTRAVENOUS | Status: AC
  Filled 2019-12-01: qty 10

## 2019-12-01 MED ORDER — ACETAMINOPHEN 325 MG PO TABS: 650.0000 mg | ORAL_TABLET | ORAL | Status: DC | PRN

## 2019-12-01 MED ORDER — 0.9 % SODIUM CHLORIDE (POUR BTL) OPTIME
TOPICAL | Status: DC | PRN
  Administered 2019-12-01: 1000 mL

## 2019-12-01 MED ORDER — ROPINIROLE HCL 1 MG PO TABS: 4.0000 mg | ORAL_TABLET | ORAL | Status: DC

## 2019-12-01 MED ORDER — ROPINIROLE HCL 1 MG PO TABS
2.0000 mg | ORAL_TABLET | ORAL | Status: DC
  Administered 2019-12-01: 12:00:00 2 mg via ORAL
  Filled 2019-12-01: qty 2

## 2019-12-01 MED ORDER — FUROSEMIDE 20 MG PO TABS: 20.0000 mg | ORAL_TABLET | Freq: Every day | ORAL | Status: DC

## 2019-12-01 MED ORDER — POLYETHYLENE GLYCOL 3350 17 G PO PACK: 17.0000 g | PACK | Freq: Every day | ORAL | Status: DC | PRN

## 2019-12-01 MED ORDER — PHENOL 1.4 % MT LIQD: 1.0000 | OROMUCOSAL | Status: DC | PRN

## 2019-12-01 MED ORDER — HYDROCODONE-ACETAMINOPHEN 5-325 MG PO TABS: 1.0000 | ORAL_TABLET | Freq: Four times a day (QID) | ORAL | Status: DC | PRN

## 2019-12-01 MED ORDER — HYDROCODONE-ACETAMINOPHEN 5-325 MG PO TABS: 2.0000 | ORAL_TABLET | ORAL | Status: DC | PRN

## 2019-12-01 MED ORDER — SUGAMMADEX SODIUM 500 MG/5ML IV SOLN
INTRAVENOUS | Status: DC | PRN
  Administered 2019-12-01: 300 mg via INTRAVENOUS

## 2019-12-01 MED ORDER — DOCUSATE SODIUM 100 MG PO CAPS: 100.0000 mg | ORAL_CAPSULE | Freq: Two times a day (BID) | ORAL | Status: DC

## 2019-12-01 MED ORDER — FENTANYL CITRATE (PF) 250 MCG/5ML IJ SOLN
INTRAMUSCULAR | Status: AC
  Filled 2019-12-01: qty 5

## 2019-12-01 MED ORDER — SODIUM CHLORIDE 0.9 % IV SOLN
250.0000 mL | INTRAVENOUS | Status: DC
  Administered 2019-12-01: 12:00:00 250 mL via INTRAVENOUS

## 2019-12-01 MED ORDER — HYDROCHLOROTHIAZIDE 25 MG PO TABS: 12.5000 mg | ORAL_TABLET | Freq: Every day | ORAL | Status: DC

## 2019-12-01 MED ORDER — ROCURONIUM BROMIDE 100 MG/10ML IV SOLN
INTRAVENOUS | Status: DC | PRN
  Administered 2019-12-01: 20 mg via INTRAVENOUS
  Administered 2019-12-01: 60 mg via INTRAVENOUS
  Administered 2019-12-01: 10 mg via INTRAVENOUS

## 2019-12-01 MED ORDER — FENTANYL CITRATE (PF) 100 MCG/2ML IJ SOLN: 25.0000 ug | INTRAMUSCULAR | Status: DC | PRN

## 2019-12-01 MED ORDER — PAROXETINE HCL 10 MG PO TABS: 10.0000 mg | ORAL_TABLET | Freq: Every day | ORAL | Status: DC

## 2019-12-01 MED ORDER — SODIUM CHLORIDE 0.9% FLUSH: 3.0000 mL | Freq: Two times a day (BID) | INTRAVENOUS | Status: DC

## 2019-12-01 MED ORDER — LIDOCAINE 2% (20 MG/ML) 5 ML SYRINGE
INTRAMUSCULAR | Status: AC
  Filled 2019-12-01: qty 5

## 2019-12-01 MED ORDER — KCL IN DEXTROSE-NACL 20-5-0.45 MEQ/L-%-% IV SOLN: INTRAVENOUS | Status: DC

## 2019-12-01 MED ORDER — FENTANYL CITRATE (PF) 100 MCG/2ML IJ SOLN
INTRAMUSCULAR | Status: DC | PRN
  Administered 2019-12-01: 50 ug via INTRAVENOUS
  Administered 2019-12-01: 100 ug via INTRAVENOUS
  Administered 2019-12-01 (×2): 50 ug via INTRAVENOUS

## 2019-12-01 MED ORDER — ROPINIROLE HCL 1 MG PO TABS: 2.0000 mg | ORAL_TABLET | ORAL | Status: DC

## 2019-12-01 MED ORDER — BACITRACIN ZINC 500 UNIT/GM EX OINT
TOPICAL_OINTMENT | CUTANEOUS | Status: DC | PRN
  Administered 2019-12-01: 1 via TOPICAL

## 2019-12-01 MED ORDER — POLYVINYL ALCOHOL 1.4 % OP SOLN: Freq: Every day | OPHTHALMIC | Status: DC | PRN

## 2019-12-01 MED ORDER — ALUM & MAG HYDROXIDE-SIMETH 200-200-20 MG/5ML PO SUSP: 30.0000 mL | Freq: Four times a day (QID) | ORAL | Status: DC | PRN

## 2019-12-01 SURGICAL SUPPLY — 52 items
CABLE BIPOLOR RESECTION CORD (MISCELLANEOUS) IMPLANT
CANISTER SUCT 3000ML PPV (MISCELLANEOUS) ×3 IMPLANT
CHARGING SYSTEM VERCISE (MISCELLANEOUS) ×3
COVER WAND RF STERILE (DRAPES) IMPLANT
DECANTER SPIKE VIAL GLASS SM (MISCELLANEOUS) ×3 IMPLANT
DRAPE CAMERA VIDEO/LASER (DRAPES) ×3 IMPLANT
DRAPE INCISE IOBAN 66X45 STRL (DRAPES) ×3 IMPLANT
DRAPE ORTHO SPLIT 77X108 STRL (DRAPES) ×4
DRAPE SURG ORHT 6 SPLT 77X108 (DRAPES) ×2 IMPLANT
DRSG OPSITE POSTOP 3X4 (GAUZE/BANDAGES/DRESSINGS) ×3 IMPLANT
DRSG OPSITE POSTOP 4X6 (GAUZE/BANDAGES/DRESSINGS) ×3 IMPLANT
DURAPREP 26ML APPLICATOR (WOUND CARE) ×6 IMPLANT
ELECT CAUTERY BLADE 6.4 (BLADE) IMPLANT
ELECT REM PT RETURN 9FT ADLT (ELECTROSURGICAL) ×3
ELECTRODE REM PT RTRN 9FT ADLT (ELECTROSURGICAL) ×1 IMPLANT
GAUZE 4X4 16PLY RFD (DISPOSABLE) ×6 IMPLANT
GLOVE BIO SURGEON STRL SZ8 (GLOVE) ×3 IMPLANT
GLOVE BIOGEL PI IND STRL 8 (GLOVE) ×1 IMPLANT
GLOVE BIOGEL PI IND STRL 8.5 (GLOVE) ×1 IMPLANT
GLOVE BIOGEL PI INDICATOR 8 (GLOVE) ×2
GLOVE BIOGEL PI INDICATOR 8.5 (GLOVE) ×2
GLOVE ECLIPSE 8.0 STRL XLNG CF (GLOVE) ×3 IMPLANT
GOWN STRL REUS W/ TWL LRG LVL3 (GOWN DISPOSABLE) ×1 IMPLANT
GOWN STRL REUS W/ TWL XL LVL3 (GOWN DISPOSABLE) IMPLANT
GOWN STRL REUS W/TWL 2XL LVL3 (GOWN DISPOSABLE) ×3 IMPLANT
GOWN STRL REUS W/TWL LRG LVL3 (GOWN DISPOSABLE) ×2
GOWN STRL REUS W/TWL XL LVL3 (GOWN DISPOSABLE)
KIT BASIN OR (CUSTOM PROCEDURE TRAY) ×3 IMPLANT
KIT CONTACT EXTENSION 55CMX8 (Miscellaneous) ×6 IMPLANT
KIT IMPLANT PULSE GENERATOR (Generator) ×3 IMPLANT
KIT REMOTE CONTROL 3 VERCISE (KITS) ×3 IMPLANT
KIT REMOVER STAPLE SKIN (MISCELLANEOUS) ×3 IMPLANT
KIT TURNOVER KIT B (KITS) ×3 IMPLANT
NEEDLE HYPO 25X1 1.5 SAFETY (NEEDLE) ×3 IMPLANT
NS IRRIG 1000ML POUR BTL (IV SOLUTION) ×3 IMPLANT
PACK LAMINECTOMY NEURO (CUSTOM PROCEDURE TRAY) ×3 IMPLANT
PAD ARMBOARD 7.5X6 YLW CONV (MISCELLANEOUS) ×9 IMPLANT
PASSER CATH 36 CODMAN DISP (NEUROSURGERY SUPPLIES) IMPLANT
PASSER CATH 65CM DISP (NEUROSURGERY SUPPLIES) ×3 IMPLANT
PENCIL BUTTON HOLSTER BLD 10FT (ELECTRODE) ×3 IMPLANT
STAPLER SKIN PROX WIDE 3.9 (STAPLE) ×3 IMPLANT
SUT ETHILON 3 0 PS 1 (SUTURE) IMPLANT
SUT SILK 2 0 PERMA HAND 18 BK (SUTURE) ×3 IMPLANT
SUT SILK 2 0 TIES 10X30 (SUTURE) IMPLANT
SUT VIC AB 2-0 CP2 18 (SUTURE) ×6 IMPLANT
SUT VIC AB 3-0 SH 8-18 (SUTURE) ×6 IMPLANT
SYSTEM CHARGING VERCISE (MISCELLANEOUS) ×1 IMPLANT
TOOL LONG TUNNEL 35 STRAW (SPINAL CORD STIMULATOR) ×3 IMPLANT
TOWEL GREEN STERILE (TOWEL DISPOSABLE) ×3 IMPLANT
TOWEL GREEN STERILE FF (TOWEL DISPOSABLE) ×3 IMPLANT
UNDERPAD 30X30 (UNDERPADS AND DIAPERS) ×3 IMPLANT
WATER STERILE IRR 1000ML POUR (IV SOLUTION) ×3 IMPLANT

## 2019-12-01 NOTE — Discharge Summary (Signed)
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  Admit date: 12/01/2019 Discharge date: 12/01/2019  Admission Diagnoses: Parkinsons Disease, s/p DBS Stage I    Discharge Diagnoses: Parkinsons Disease, s/p DBS Stage I s/p bilateral implantable pulse generator placement (Bilateral) - bilateral implantable pulse generator placement with extensions (DBS Stage II)   Active Problems:   Parkinson's disease [facility])   Discharged Condition: good  Hospital Course: Jon Lall was admitted this am for placement of lead extensions and implantable pulse generator for deep brain stimulator. Following uncomplicated surgery, he recovered well and transferred to [facility] for nursing care. He is doing well.  Consults: None  Significant Diagnostic Studies:   Treatments: surgery: bilateral implantable pulse generator placement (Bilateral) - bilateral implantable pulse generator placement with extensions (DBS Stage II)    Discharge Exam: Blood pressure (!) 154/76, pulse 77, temperature 98.5 F (36.9 C), temperature source Oral, resp. rate 18, height 6\' 1"  (1.854 m), weight (!) 152 kg, SpO2 94 %. Alert, conversant, reporting no pain. Left scalp and left chest incisions without erythema, swelling or drainage. Honeycomb over bacitracin ointment and staples scalp; Honeycomb over Dermabond chest. Post-op IVAB infusing at present.            Disposition:  Discharge to home.  He has Norco 5/325 at his pharmacy for prn use. Ok to shower, may remove drsgs tomorrow. Will plan for office f/u on the 19th for staple removal to coincide with his neurology appt (due to distance to home). Pt knows to call with any questions or concerns in the meanwhile.          There are no questions and answers to display.      Allergies as of 12/01/2019   No Known Allergies     Medication List    STOP taking these medications    cephALEXin 500 MG capsule Commonly known as: KEFLEX     TAKE these medications   atorvastatin 40 MG tablet Commonly known as: LIPITOR Take 40 mg by mouth daily.   carbidopa-levodopa 25-100 MG tablet Commonly known as: SINEMET IR 3 TABLETS BY MOUTH AT 6AM 2 TABLETS AT 9AM 2 TABLETS AT 1 PM 1 TABLETS AT 4PM AND 1 TABLET AT 7PM What changed:   how much to take  how to take this  when to take this  additional instructions   clonazePAM 0.5 MG tablet Commonly known as: KLONOPIN Take 0.5 tablets (0.25 mg total) by mouth at bedtime. What changed:   when to take this  reasons to take this   furosemide 20 MG tablet Commonly known as: LASIX Take 20 mg by mouth daily.   hydrochlorothiazide 12.5 MG tablet Commonly known as: HYDRODIURIL Take 12.5 mg by mouth daily.   HYDROcodone-acetaminophen 5-325 MG tablet Commonly known as: NORCO/VICODIN Take 1 tablet by mouth every 6 (six) hours as needed for moderate pain.   HYDROcodone-acetaminophen 5-325 MG tablet Commonly known as: NORCO/VICODIN Take 1-2 tablets by mouth every 4 (four) hours as needed for severe pain ((score 7 to 10)).   PARoxetine 10 MG tablet Commonly known as: PAXIL TAKE ONE TABLET BY MOUTH DAILY   rOPINIRole 2 MG tablet Commonly known as: Requip 2 in the morning, 1 in the afternoon, 1 in the evening What changed:   how much to take  how to take this  when to take this  additional instructions   sildenafil 100 MG tablet Commonly known as: VIAGRA Take 100 mg by  mouth as needed for erectile dysfunction.   SYSTANE OP Place 1 drop into both eyes daily as needed (dry eyes).   tamsulosin 0.4 MG Caps capsule Commonly known as: FLOMAX Take 0.4 mg by mouth daily after breakfast.   terbinafine 1 % cream Commonly known as: LAMISIL Apply 1 application topically daily as needed (ringworm).        Signed: Peggyann Shoals, MD 12/01/2019, 2:36 PM

## 2019-12-01 NOTE — Op Note (Signed)
12/01/2019  9:48 AM  PATIENT:  [name] [name], DDS  58 y.o. male  PRE-OPERATIVE DIAGNOSIS:  Parkinsons Disease, s/p DBS Stage I  POST-OPERATIVE DIAGNOSIS:  Parkinsons Disease, s/p DBS Stage I  PROCEDURE:  Procedure(s) with comments: bilateral implantable pulse generator placement (Bilateral) - bilateral implantable pulse generator placement with extensions (DBS Stage II)  SURGEON:  Surgeon(s) and Role:    * Undrea Shipes, MD - Primary  PHYSICIAN ASSISTANT:   ASSISTANTS: P[place]at, RN   ANESTHESIA:   general  EBL:  40 mL   BLOOD ADMINISTERED:none  DRAINS: none   LOCAL MEDICATIONS USED:  MARCAINE    and LIDOCAINE   SPECIMEN:  No Specimen  DISPOSITION OF SPECIMEN:  N/A  COUNTS:  YES  TOURNIQUET:  * No tourniquets in log *  DICTATION: DICTATION: Patient has implanted bilateral subthalamic stimulator electrodes having recently completed DBS Stage I and now presents for placement of lead extensions and IPG implantation.  PROCEDURE: Patient was brought to the operating room and GETA anesthesia was induced.  Left upper chest, scalp, neck were prepped with betadine scrub and Duraprep.  Area of planned incision was infiltrated with lidocaine.  Scalp incision was made and the lead extensions were exposed. An incision was made in the right upper chest and a pocket was created.  Extension tunnel was made from scalp to pocket.  Vercise Gevia dual channel [employer] rechargeable IPG was placed and attached to lead extensions, which in turn were connected to cranial leads and torqued appropriately.  Covering boots were placed.     The IPG  was placed in the pocket.  Wounds were irrigated with vancomycin.  Incisions were closed with 2-0 Vicryl and 3-0 vicryl sutures at the pocket and 2-0 vicryl at the scalp with staples. and dressed with a sterile occlusive dressing.  Counts were correct at the end of the case.  PLAN OF CARE: Admit for overnight observation  PATIENT DISPOSITION:  PACU  - hemodynamically stable.   Delay start of Pharmacological VTE agent (>24hrs) due to surgical blood loss or risk of bleeding: yes  

## 2019-12-01 NOTE — Brief Op Note (Signed)
12/01/2019  9:48 AM  PATIENT:  Linwood Dibbles, DDS  59 y.o. male  PRE-OPERATIVE DIAGNOSIS:  Parkinsons Disease, s/p DBS Stage I  POST-OPERATIVE DIAGNOSIS:  Parkinsons Disease, s/p DBS Stage I  PROCEDURE:  Procedure(s) with comments: bilateral implantable pulse generator placement (Bilateral) - bilateral implantable pulse generator placement with extensions (DBS Stage II)  SURGEON:  Surgeon(s) and Role:    [name], MD - Primary  PHYSICIAN ASSISTANT:   ASSISTANTS: Poteat, RN   ANESTHESIA:   general  EBL:  40 mL   BLOOD ADMINISTERED:none  DRAINS: none   LOCAL MEDICATIONS USED:  MARCAINE    and LIDOCAINE   SPECIMEN:  No Specimen  DISPOSITION OF SPECIMEN:  N/A  COUNTS:  YES  TOURNIQUET:  * No tourniquets in log *  DICTATION: DICTATION: Patient has implanted bilateral subthalamic stimulator electrodes having recently completed DBS Stage I and now presents for placement of lead extensions and IPG implantation.  PROCEDURE: Patient was brought to the operating room and GETA anesthesia was induced.  Left upper chest, scalp, neck were prepped with betadine scrub and Duraprep.  Area of planned incision was infiltrated with lidocaine.  Scalp incision was made and the lead extensions were exposed. An incision was made in the right upper chest and a pocket was created.  Extension tunnel was made from scalp to pocket.  Vercise Gevia dual Music therapist IPG was placed and attached to lead extensions, which in turn were connected to cranial leads and torqued appropriately.  Covering boots were placed.     The IPG  was placed in the pocket.  Wounds were irrigated with vancomycin.  Incisions were closed with 2-0 Vicryl and 3-0 vicryl sutures at the pocket and 2-0 vicryl at the scalp with staples. and dressed with a sterile occlusive dressing.  Counts were correct at the end of the case.  PLAN OF CARE: Admit for overnight observation  PATIENT DISPOSITION:  PACU  - hemodynamically stable.   Delay start of Pharmacological VTE agent (>24hrs) due to surgical blood loss or risk of bleeding: yes

## 2019-12-01 NOTE — Progress Notes (Signed)
Patient ID: [name] [name], DDS, male   DOB: Jun 17, 1961, 59 y.o.   MRN: [id number] Alert, conversant, reporting no pain. Left scalp and left chest incisions without erythema, swelling or drainage. Honeycomb over bacitracin ointment and staples scalp; Honeycomb over Dermabond chest.  Post-op IVAB infusing at present.  Ok per Dr. Venetia Maxon to d/c to home. He has Norco 5/325 at his pharmacy for prn use. Ok to shower, may remove drsgs tomorrow. Will plan for office f/u on the 19th for staple removal to coincide with his neurology appt (due to distance to home). Pt knows to call with any questions or concerns in the meanwhile.

## 2019-12-01 NOTE — Anesthesia Procedure Notes (Signed)
Procedure Name: Intubation Date/Time: 12/01/2019 7:42 AM Performed by: Jed Limerick, CRNA Pre-anesthesia Checklist: Patient identified, Emergency Drugs available, Suction available and Patient being monitored Patient Re-evaluated:Patient Re-evaluated prior to induction Oxygen Delivery Method: Circle System Utilized Preoxygenation: Pre-oxygenation with 100% oxygen Induction Type: IV induction Ventilation: Oral airway inserted - appropriate to patient size and Two handed mask ventilation required Laryngoscope Size: Glidescope and 4 Grade View: Grade I Tube type: Oral Tube size: 7.5 mm Number of attempts: 1 Airway Equipment and Method: Stylet and Oral airway Placement Confirmation: ETT inserted through vocal cords under direct vision,  positive ETCO2 and breath sounds checked- equal and bilateral Secured at: 24 cm Tube secured with: Tape Dental Injury: Teeth and Oropharynx as per pre-operative assessment  Difficulty Due To: Difficult Airway- due to limited oral opening and Difficulty was anticipated

## 2019-12-01 NOTE — Discharge Instructions (Signed)
Wound Care Leave incision open to air. You may shower. Do not scrub directly on incision.  Do not put any creams, lotions, or ointments on incision. Activity Walk each and every day, increasing distance each day. No lifting greater than 5 lbs.  Avoid bending, arching, and twisting.  Diet Resume your normal diet.  Return to Work  Call Your Doctor If Any of These Occur Redness, drainage, or swelling at the wound.  Temperature greater than 101 degrees. Severe pain not relieved by pain medication. Incision starts to come apart. Follow Up Appt Call  [phone])  for problems.

## 2019-12-01 NOTE — Transfer of Care (Signed)
Immediate Anesthesia Transfer of Care Note  Patient: [name] [name], DDS  Procedure(s) Performed: bilateral implantable pulse generator placement (Bilateral )  Patient Location: PACU  Anesthesia Type:General  Level of Consciousness: awake, alert  and oriented  Airway & Oxygen Therapy: Patient Spontanous Breathing and Patient connected to face mask oxygen  Post-op Assessment: Report given to RN and Post -op Vital signs reviewed and stable  Post vital signs: Reviewed and stable  Last Vitals:  Vitals Value Taken Time  BP    Temp    Pulse    Resp    SpO2      Last Pain:  Vitals:   12/01/19 0612  TempSrc: Oral  PainSc:       Patients Stated Pain Goal: 3 (12/01/19 0600)  Complications: No apparent anesthesia complications

## 2019-12-01 NOTE — Anesthesia Postprocedure Evaluation (Signed)
Anesthesia Post Note  Patient: Linwood Dibbles, DDS  Procedure(s) Performed: bilateral implantable pulse generator placement (Bilateral )     Patient location during evaluation: PACU Anesthesia Type: General Level of consciousness: awake and alert Pain management: pain level controlled Vital Signs Assessment: post-procedure vital signs reviewed and stable Respiratory status: spontaneous breathing, nonlabored ventilation and respiratory function stable Cardiovascular status: blood pressure returned to baseline and stable Postop Assessment: no apparent nausea or vomiting Anesthetic complications: no    Last Vitals:  Vitals:   12/01/19 1039 12/01/19 1314  BP: (!) 155/81 (!) 154/76  Pulse: 67 77  Resp: 20 18  Temp: (!) 36.4 C 36.9 C  SpO2:  94%    Last Pain:  Vitals:   12/01/19 1314  TempSrc: Oral  PainSc:                  Cecile Hearing

## 2019-12-01 NOTE — Plan of Care (Signed)
Patient alert and oriented, mae's well, voiding adequate amount of urine, swallowing without difficulty, no c/o pain at time of discharge. Patient discharged home with family. Script and discharged instructions given to patient. Patient and family stated understanding of instructions given. Patient has an appointment with Dr.Stern    

## 2019-12-01 NOTE — Interval H&P Note (Signed)
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  The various methods of treatment have been discussed with the patient and family. After consideration of risks, benefits and other options for treatment, the patient has consented to  Procedure(s) with comments: bilateral implantable pulse generator placement (Bilateral) - bilateral implantable pulse generator placement as a surgical intervention.  The patient's history has been reviewed, patient examined, no change in status, stable for surgery.  I have reviewed the patient's chart and labs.  Questions were answered to the patient's satisfaction.     Dorian Heckle

## 2019-12-02 ENCOUNTER — Encounter: Payer: Self-pay | Admitting: *Deleted

## 2019-12-18 NOTE — Progress Notes (Signed)
Assessment/Plan:   1.   Parkinsons Disease             -Decrease carbidopa/levodopa 25/100: 2 tablets at 6 AM/1 tablet at 10 AM/1 tablet at 2 PM/1 tablet at 5 PM             -Decrease ropinirole, 2 mg, 1 tablet 4-5 times per day for 1 week.  Hopeful to decrease further given history of compulsive behaviors/gambling.  -Patient is status post DBS to the bilateral STN on November 24, 2019.  BrainLab images are reviewed and images show a well-placed lead.  DBS programming was completed today.  It is described in more detail on a separate programming procedure note.   2.  Depression             -continue paxil, 10 mg daily  3.  RBD             -on klonopin, 0.25 mg qhs.  Pt really only using prn insomnia  4.  We will follow up with the patient next week and do programming some more.   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  My previous records were reviewed prior to todays visit as well as outside records available to me. Pt has had bilateral STN DBS on March 23 and his IPG placed on March 30.  Surgery went well.  Images have been reviewed in detail through [employer].  His leads look well-placed.  Patient states that he had a strong microelectrode effect, so actually has been decreasing his medication.  It has just been in the last week or 2 that he has had increased medication somewhat, but still not back at what he previously was.  He is off of medication today, so did not sleep well last night.  Current prescribed movement disorder medications: carbidopa/levodopa 25/100 3 tablets at 6 AM/2 tablets at 9 AM/2 tablets at 1 PM/1 tablet at 4 PM/1 tablet at 7 PM (he states he is probably only taking 6 tablets per day right now)  Requip 2 mg, 2 tablets in the morning, 1 tablet in the afternoon and 1 tablet at the evening.  (pt states that he is only taking 2-3 total tablets per day) Klonopin 0.5mg , 1/2 q hs prn    ALLERGIES:  No Known  Allergies  CURRENT MEDICATIONS:  Outpatient Encounter Medications as of 12/21/2019  Medication Sig  . atorvastatin (LIPITOR) 40 MG tablet Take 40 mg by mouth daily.  . carbidopa-levodopa (SINEMET IR) 25-100 MG tablet 3 TABLETS BY MOUTH AT 6AM 2 TABLETS AT 9AM 2 TABLETS AT 1 PM 1 TABLETS AT 4PM AND 1 TABLET AT 7PM (Patient taking differently: Take 2-3 tablets by mouth See admin instructions. Take 3 tablets at  0500 ,  2 tablets at 1200, 2 tablets 1600, 2 tablets at 1900)  . clonazePAM (KLONOPIN) 0.5 MG tablet Take 0.5 tablets (0.25 mg total) by mouth at bedtime. (Patient taking differently: Take 0.25 mg by mouth at bedtime as needed (sleep). )  . furosemide (LASIX) 20 MG tablet Take 20 mg by mouth daily.  . hydrochlorothiazide (HYDRODIURIL) 12.5 MG tablet Take 12.5 mg by mouth daily.   [name] HYDROcodone-acetaminophen (NORCO/VICODIN) 5-325 MG tablet Take 1 tablet by mouth every 6 (six) hours as needed for moderate pain.  [name] PARoxetine (PAXIL) 10 MG tablet TAKE ONE TABLET BY MOUTH DAILY  . Polyethyl Glycol-Propyl Glycol (SYSTANE OP) Place 1 drop into both eyes  daily as needed (dry eyes).  [name] rOPINIRole (REQUIP) 2 MG tablet 2 in the morning, 1 in the afternoon, 1 in the evening (Patient taking differently: Take 2-4 mg by mouth See admin instructions. 4 mg at 0500, 2 mg at 1300 and 2 mg at 1900)  . sildenafil (VIAGRA) 100 MG tablet Take 100 mg by mouth as needed for erectile dysfunction.  . tamsulosin (FLOMAX) 0.4 MG CAPS capsule Take 0.4 mg by mouth daily after breakfast.   . terbinafine (LAMISIL) 1 % cream Apply 1 application topically daily as needed (ringworm).  . [DISCONTINUED] HYDROcodone-acetaminophen (NORCO/VICODIN) 5-325 MG tablet Take 1-2 tablets by mouth every 4 (four) hours as needed for severe pain ((score 7 to 10)). (Patient not taking: Reported on 12/21/2019)   No facility-administered encounter medications on file as of 12/21/2019.    Objective:   PHYSICAL EXAMINATION:    VITALS:    Vitals:   12/21/19 1204  BP: 140/76  Pulse: 71  SpO2: 97%  Weight: (!) 338 lb (153.3 kg)  Height: 6\' 1"  (1.854 m)    GEN:  The patient appears stated age and is in NAD. HEENT:  Normocephalic, atraumatic.  The mucous membranes are moist. The superficial temporal arteries are without ropiness or tenderness. CV:  RRR Lungs:  CTAB Neck/HEME:  There are no carotid bruits bilaterally.  Neurological examination:  Orientation: The patient is alert and oriented x3. Cranial nerves: There is good facial symmetry with mild facial hypomimia. The speech is fluent and clear. Soft palate rises symmetrically and there is no tongue deviation. Hearing is intact to conversational tone. Sensation: Sensation is intact to light touch throughout Motor: Strength is at least antigravity x4.  Movement examination: Tone: Prior to programming, there was moderate to severe increased tone in the right upper extremity and mild to moderate in the left upper extremity. Abnormal movements: Prior to programming, there was right foot tremor.  During programming, there was right foot dyskinesia.  Following programming, there was neither of these things. Coordination: Prior to programming, the patient had a significant decremation in all rapid alternating movements, right greater than left.  Post programming, the patient was doing markedly better. Gait and Station: Following programming, the patient walked very well down the hall with good arm swing bilaterally.   Total time spent on today's visit was 30 minutes, with greater than 50% in counseling. This did not include the extensive amount of time spent doing DBS, on separate procedural note.  Cc:  System, Pcp Not In

## 2019-12-21 ENCOUNTER — Ambulatory Visit (INDEPENDENT_AMBULATORY_CARE_PROVIDER_SITE_OTHER): Payer: BC Managed Care – PPO | Admitting: Neurology

## 2019-12-21 ENCOUNTER — Other Ambulatory Visit: Payer: Self-pay

## 2019-12-21 ENCOUNTER — Encounter: Payer: Self-pay | Admitting: Neurology

## 2019-12-21 VITALS — BP 140/76 | HR 71 | Ht 73.0 in | Wt 338.0 lb

## 2019-12-21 DIAGNOSIS — G20 Parkinson's disease: Secondary | ICD-10-CM | POA: Diagnosis not present

## 2019-12-21 MED ORDER — ROPINIROLE HCL 1 MG PO TABS: ORAL_TABLET | ORAL | 1 refills | Status: DC

## 2019-12-21 NOTE — Patient Instructions (Signed)
Your new schedule for medication is as follows:  1.  carbidopa/levodopa 25/100 2 tablets at 6 AM/1 tablets at 10 AM/1 tablets at 2 PM/1 tablet at 5 PM  2.  Requip 1mg , 1 tablet 4-5 times per day (changing from 2mg  pill)

## 2019-12-21 NOTE — Procedures (Signed)
DBS Programming was performed.    Manufacturer of DBS device: [employer]  Total time spent programming was 55 minutes.  Device was turned on.  Soft start was confirmed to be on.  Impedences were checked and were within normal limits.  Battery was checked and was determined to be functioning normally and not near the end of life.  Final settings were as follows:   Active Contacts Amplitude (mA) PW (ms) Frequency (hz) Side  effects  Left Brain       12/21/19 (5/6/7)-C+ 2.7(2.0-3.0 with pt control) 60 130   Complete monopolar review 1-C+ 2.5 60 130 Continued foot tremor   (2/3/4)-C+ 2.5 60 130 Did well   (5/6/7)-C+ 2.5 60 130 ?mild arm rigidity   (5/6/7)-C+ 3.0 60 130 Mild foot dyskinesia; no rigid   8-C+ 2.5 60 130 Slight decreased RAMS                       Right Brain       12/21/19 (5/6/7)-C+ 2.6 60 130   Complete monopolar review 1-C+ 2.5 60 130 Arm rigidity still   (2/3/4)-C+ 2.6 60 130 Better rigidity   (5/6/7)-C+ 2.6 60 130    8-C+ 2.6 60 130 Still good

## 2019-12-24 NOTE — Progress Notes (Signed)
Assessment/Plan:   1.  Parkinsons Disease  -Patient is status post DBS to the bilateral STN on November 24, 2019.  -pt already decreased requip to 1 mg bid.  Has noted decreased compulsive behavior (gambling).  We will need to watch and make sure he did not reduce it too fast.  If so, patient may feel bad/fatigued/depressed.  -Continue carbidopa/levodopa 25/100, 2/2/1.  -Asks about physical therapy and a trainer.  I think he needs both.  I told him once he finds a physical therapy place where he lives, to let me know and I will send a prescription for it.  He agrees.  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  Patient still has staples in his scalp.  They are going to have the patient go over to their office after our visit and his nurse will go ahead and remove those.  2.  Parkinson's dyskinesia  -Does have some dyskinesia in the right foot.  I did decrease his stim on the left STN.  He had increased it on his own and returned with more dyskinesia, which the patient really did not notice.  I increased the pulse width just a little bit.  The dyskinesia is not bothersome to him  3.  We will plan on seeing him back in the next 8 or 9 months.  If he needs me before then, he is to call me.   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  My previous records were reviewed prior to todays visit as well as outside records available to me. Pt denies falls.  His DBS device was activated last visit.  Medications were subsequently reduced.  He reports that he has turned up the right side of the body.  He thought he was having some tremor prior to that.  Overall, he feels really good.  He admits he probably needs physical therapy.  Current prescribed movement disorder medications: carbidopa/levodopa 25/100: 2 tablets at 6 AM/1 tablet at 10 AM/1 tablet at 2 PM/1 tablet at 5 PM (pt actually taking 2 tablets at 5am/2 at noon/1 at 4pm) Ropinirole, 1 mg, 1 tablet 4-5  times per day (pt reduced on own to bid dosing)    ALLERGIES:  No Known Allergies  CURRENT MEDICATIONS:  Outpatient Encounter Medications as of 12/28/2019  Medication Sig  . atorvastatin (LIPITOR) 40 MG tablet Take 40 mg by mouth daily.  . carbidopa-levodopa (SINEMET IR) 25-100 MG tablet 3 TABLETS BY MOUTH AT 6AM 2 TABLETS AT 9AM 2 TABLETS AT 1 PM 1 TABLETS AT 4PM AND 1 TABLET AT 7PM (Patient taking differently: Take 2-3 tablets by mouth See admin instructions. Take 2 tablets at  0500 ,  2 tablets at 1200, 1 tablets 1600, 0 tablets at 1900)  . clonazePAM (KLONOPIN) 0.5 MG tablet Take 0.5 tablets (0.25 mg total) by mouth at bedtime. (Patient taking differently: Take 0.25 mg by mouth at bedtime as needed (sleep). )  . furosemide (LASIX) 20 MG tablet Take 20 mg by mouth daily.  . hydrochlorothiazide (HYDRODIURIL) 12.5 MG tablet Take 12.5 mg by mouth daily.   [name] HYDROcodone-acetaminophen (NORCO/VICODIN) 5-325 MG tablet Take 1 tablet by mouth every 6 (six) hours as needed for moderate pain.  [name] PARoxetine (PAXIL) 10 MG tablet TAKE ONE TABLET BY MOUTH DAILY  . Polyethyl Glycol-Propyl Glycol (SYSTANE OP) Place 1 drop into both eyes daily as needed (dry eyes).  [name] rOPINIRole (REQUIP) 1 MG tablet 1  tablet 4-5 times per day as directed (Patient taking differently: Take 1 mg by mouth in the morning and at bedtime. )  . sildenafil (VIAGRA) 100 MG tablet Take 100 mg by mouth as needed for erectile dysfunction.  . tamsulosin (FLOMAX) 0.4 MG CAPS capsule Take 0.4 mg by mouth daily after breakfast.   . terbinafine (LAMISIL) 1 % cream Apply 1 application topically daily as needed (ringworm).  . [DISCONTINUED] rOPINIRole (REQUIP) 2 MG tablet TAKE TWO TABLETS IN THE MORNING ONE TABLET IN THE AFTERNOON AND ONE TABLET IN THE EVENING (Patient not taking: Reported on 12/28/2019)   No facility-administered encounter medications on file as of 12/28/2019.    Objective:   PHYSICAL EXAMINATION:    VITALS:   Vitals:    12/28/19 1433  BP: 118/69  Pulse: 76  SpO2: 95%  Weight: (!) 342 lb (155.1 kg)  Height: 6\' 1"  (1.854 m)    GEN:  The patient appears stated age and is in NAD. HEENT:  Normocephalic.  Staples are present over the left side of the scalp.  No erythema.  No drainage.  the mucous membranes are moist. The superficial temporal arteries are without ropiness or tenderness. CV:  RRR Lungs:  CTAB.  Some DOE Neck/HEME:  There are no carotid bruits bilaterally.  Neurological examination:  Orientation: The patient is alert and oriented x3. Cranial nerves: There is good facial symmetry with no facial hypomimia. The speech is fluent and clear. Soft palate rises symmetrically and there is no tongue deviation. Hearing is intact to conversational tone. Sensation: Sensation is intact to light touch throughout Motor: Strength is at least antigravity x4.  Movement examination: Tone: There is normal tone in the upper and lower extremities Abnormal movements: Some dyskinesia in the right foot Coordination:  There is minimal decremation with RAM's, with finger taps on the left Gait and Station: The patient has no difficulty arising out of a deep-seated chair without the use of the hands. The patient's stride length is good, and perhaps even exaggerated arm swing on the right.     Total time spent on today's visit was 30 minutes, including both face-to-face time and nonface-to-face time.  Time included that spent on review of records (prior notes available to me/labs/imaging if pertinent), discussing treatment and goals, answering patient's questions and coordinating care.  This did not include DBS time.  Cc:  System, Pcp Not In

## 2019-12-25 ENCOUNTER — Encounter: Payer: BC Managed Care – PPO | Admitting: Neurology

## 2019-12-27 ENCOUNTER — Other Ambulatory Visit: Payer: Self-pay | Admitting: Neurology

## 2019-12-28 ENCOUNTER — Encounter: Payer: Self-pay | Admitting: Neurology

## 2019-12-28 ENCOUNTER — Ambulatory Visit (INDEPENDENT_AMBULATORY_CARE_PROVIDER_SITE_OTHER): Payer: BC Managed Care – PPO | Admitting: Neurology

## 2019-12-28 ENCOUNTER — Other Ambulatory Visit: Payer: Self-pay

## 2019-12-28 VITALS — BP 118/69 | HR 76 | Ht 73.0 in | Wt 342.0 lb

## 2019-12-28 DIAGNOSIS — G20 Parkinson's disease: Secondary | ICD-10-CM | POA: Diagnosis not present

## 2019-12-28 NOTE — Procedures (Signed)
DBS Programming was performed.    Manufacturer of DBS device: [employer]  Total time spent programming was 25 minutes.  Device was on.  Soft start was confirmed to be on.  Impedences were checked and were within normal limits.  Battery was checked and was determined to be functioning normally and not near the end of life.  Final settings were as follows:   Active Contacts Amplitude (mA) PW (ms) Frequency (hz) Side  effects  Left Brain       12/28/19 (5/6/7)-C+ 2.7(2.0-3.1) 60 136 Pt had on 3.0 when came in and had R foot dyskinesia  12/21/19 (5/6/7)-C+ 2.7(2.0-3.0 with pt control) 60 130   Complete monopolar review 1-C+ 2.5 60 130 Continued foot tremor   (2/3/4)-C+ 2.5 60 130 Did well   (5/6/7)-C+ 2.5 60 130 ?mild arm rigidity   (5/6/7)-C+ 3.0 60 130 Mild foot dyskinesia; no rigid   8-C+ 2.5 60 130 Slight decreased RAMS                       Right Brain       12/28/19 (5/6/7)-C+ 2.6 60 136   12/21/19 (5/6/7)-C+ 2.6 60 130   Complete monopolar review 1-C+ 2.5 60 130 Arm rigidity still   (2/3/4)-C+ 2.6 60 130 Better rigidity   (5/6/7)-C+ 2.6 60 130    8-C+ 2.6 60 130 Still good

## 2020-01-07 ENCOUNTER — Encounter: Payer: Self-pay | Admitting: Neurology

## 2020-01-08 ENCOUNTER — Other Ambulatory Visit: Payer: Self-pay | Admitting: Neurology

## 2020-01-19 ENCOUNTER — Telehealth: Payer: Self-pay

## 2020-01-19 NOTE — Telephone Encounter (Signed)
Spoke with [place] at [facility] and she requested a verbal order for the patient to return back to work. She received the letter from the patient but it was not sign by Dr Tat and a signature is required. She requested we fax over the letter with Dr Don Perking signature.   Fax: [phone]  Informed Darreld Mclean that I would have the letter signed and faxed over tomorrow. She voiced understanding.

## 2020-02-03 ENCOUNTER — Encounter: Payer: Self-pay | Admitting: Neurology

## 2020-02-03 ENCOUNTER — Telehealth: Payer: Self-pay | Admitting: Neurology

## 2020-02-03 NOTE — Telephone Encounter (Signed)
Patient made aware that the letter will be faxed over to him tomorrow. He voiced understanding.

## 2020-02-03 NOTE — Telephone Encounter (Signed)
Patient called and requested an updated letter stating he was okay to start work on 01/18/20. He originally had given the date of 01/07/20.  Please send letter through MyChart per patient request.

## 2020-02-04 ENCOUNTER — Other Ambulatory Visit: Payer: Self-pay | Admitting: Neurology

## 2020-02-04 NOTE — Telephone Encounter (Signed)
Rx(s) sent to pharmacy electronically.  

## 2020-02-25 ENCOUNTER — Other Ambulatory Visit: Payer: Self-pay | Admitting: Neurology

## 2020-02-25 NOTE — Telephone Encounter (Signed)
Rx(s) sent to pharmacy electronically.  

## 2020-06-15 ENCOUNTER — Other Ambulatory Visit: Payer: Self-pay | Admitting: Neurology

## 2020-06-15 NOTE — Telephone Encounter (Signed)
Rx(s) sent to pharmacy electronically.  

## 2020-09-05 NOTE — Progress Notes (Signed)
Assessment/Plan:   1.  Parkinsons Disease  -Patient is status post DBS to the bilateral STN on November 24, 2019.  -continue requip 1 mg bid  -Take carbidopa/levodopa 25/100, 3/2/2.  -if needs additional levodopa at bed, would consider adding a CR at bed rather than adding additional IR  -exercise  2.  Parkinson's dyskinesia  -Does have some dyskinesia in the right foot.    3. 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  He will f/u with ortho or rheum.   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  My previous records were reviewed prior to todays visit as well as outside records available to me. Pt had 2 falls that he relates to new bifocals - one he tripped over the curb and the other he was going down stairs and he thought was at the bottom step.  He is having some trouble walking because he needs a new knee and plans to have a surgery.   Wonders if CTS getting worse.  Pain getting worse around wrist itself.  Patient was in the hospital on December 16 for biopsy of a hypopharynx lesion that had been bleeding.  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   Pt denies lightheadedness, near syncope.  No hallucinations.  Mood has been good.  Considering changing jobs and leaving [employer].    Current prescribed movement disorder medications: carbidopa/levodopa 25/100: 2/2/1 (pt states that he is actually taking 3 in the AM, 2 at 1pm, 2 at 7pm and 2 at midnight if he cannot sleep) Ropinirole, 1 mg, 1 tablet bid    ALLERGIES:  No Known Allergies  CURRENT MEDICATIONS:  Outpatient Encounter Medications as of 09/06/2020  Medication Sig   atorvastatin (LIPITOR) 40 MG tablet Take 40 mg by mouth daily.   carbidopa-levodopa (SINEMET IR) 25-100 MG tablet TAKE 3 TABLETS BY MOUTH AT  6AM AND 2 TABLETS AT 9AM AND 2 TABLETS AT AT 1:00PM AND 1 TABLET AT 4PM AND 1 TABLET AT 7PM (Patient taking differently: TAKE 3 TABLETS BY MOUTH AT 6AM AND  2 TABLETS AT AT 1:00PM AND 2 TABLET AT 7PM occassional 1 tab at midnight)   furosemide (LASIX) 20 MG tablet Take 20 mg by mouth daily.   hydrochlorothiazide (HYDRODIURIL) 12.5 MG tablet Take 12.5 mg by mouth daily.    PARoxetine (PAXIL) 10 MG tablet TAKE ONE TABLET BY MOUTH DAILY   Polyethyl Glycol-Propyl Glycol (SYSTANE OP) Place 1 drop into both eyes daily as needed (dry eyes).   rOPINIRole (REQUIP) 1 MG tablet Take 1 tablet (1 mg total) by mouth in the morning and at bedtime.   sildenafil (VIAGRA) 100 MG tablet Take 100 mg by mouth as needed for erectile dysfunction.   tamsulosin (FLOMAX) 0.4 MG CAPS capsule Take 0.4 mg by mouth daily after breakfast.    terbinafine (LAMISIL) 1 % cream Apply 1 application topically daily as needed (ringworm).   clonazePAM (KLONOPIN) 0.5 MG tablet Take 0.5 tablets (0.25 mg total) by mouth at bedtime. (Patient not taking: Reported on 09/06/2020)   [DISCONTINUED] HYDROcodone-acetaminophen (NORCO/VICODIN) 5-325 MG tablet Take 1 tablet by mouth every 6 (six) hours as needed for moderate pain. (Patient not taking: Reported on 09/06/2020)   [DISCONTINUED] rOPINIRole (REQUIP) 2 MG tablet  TAKE TWO TABLETS BY MOUTH EVERY MORNING 1 TABLET IN THE AFTERNOON AND 1 TABLET IN THE EVENING   No facility-administered encounter medications on file as of 09/06/2020.    Objective:   PHYSICAL EXAMINATION:    VITALS:   Vitals:   09/06/20 1339  BP: (!) 144/88  Pulse: 76  SpO2: 97%  Weight: (!) 364 lb (165.1 kg)  Height: 6\' 1"  (1.854 m)    GEN:  The patient appears stated age and is in NAD. Lungs:  Some DOE  Neurological examination:  Orientation: The patient is alert and oriented x3. Cranial nerves: There is good facial symmetry with no facial hypomimia. The speech is fluent and clear. Soft palate rises  symmetrically and there is no tongue deviation. Hearing is intact to conversational tone. Sensation: Sensation is intact to light touch throughout Motor: Strength is at least antigravity x4.  Movement examination: Tone: There is mild increased tone in the LUE.  Tone elsewhere is nl Abnormal movements: Some dyskinesia in the right foot (rare) Coordination:  There is good RAMs Gait and Station: The patient has no difficulty arising out of a deep-seated chair without the use of the hands. The patient's stride length is good, and perhaps even exaggerated arm swing on the right.     Total time spent on today's visit was 30 minutes, including both face-to-face time and nonface-to-face time.  Time included that spent on review of records (prior notes available to me/labs/imaging if pertinent), discussing treatment and goals, answering patient's questions and coordinating care.  This did not include DBS time.  Cc:  Pcp, No

## 2020-09-05 NOTE — Procedures (Unsigned)
DBS Programming was performed.    Manufacturer of DBS device: [employer]  Total time spent programming was 25 minutes.  Device was on.  Soft start was confirmed to be on.  Impedences were checked and were within normal limits.  Battery was checked and was determined to be functioning normally and not near the end of life.  Final settings were as follows:   Active Contacts Amplitude (mA) PW (ms) Frequency (hz) Side  effects  Left Brain                                   09/06/20 (5/6/7)-C+ 2.8(2.0-3.1) 60 136   12/28/19 (5/6/7)-C+ 2.7(2.0-3.1) 60 136 Pt had on 3.0 when came in and had R foot dyskinesia  12/21/19 (5/6/7)-C+ 2.7(2.0-3.0 with pt control) 60 130   Complete monopolar review 1-C+ 2.5 60 130 Continued foot tremor   (2/3/4)-C+ 2.5 60 130 Did well   (5/6/7)-C+ 2.5 60 130 ?mild arm rigidity   (5/6/7)-C+ 3.0 60 130 Mild foot dyskinesia; no rigid   8-C+ 2.5 60 130 Slight decreased RAMS                       Right Brain                            09/06/20 (5/6/7)-C+ 2.8(2.0-3.0) 60 136   12/28/19 (5/6/7)-C+ 2.6 60 136   12/21/19 (5/6/7)-C+ 2.6 60 130   Complete monopolar review 1-C+ 2.5 60 130 Arm rigidity still   (2/3/4)-C+ 2.6 60 130 Better rigidity   (5/6/7)-C+ 2.6 60 130    8-C+ 2.6 60 130 Still good

## 2020-09-06 ENCOUNTER — Encounter: Payer: Self-pay | Admitting: Neurology

## 2020-09-06 ENCOUNTER — Ambulatory Visit (INDEPENDENT_AMBULATORY_CARE_PROVIDER_SITE_OTHER): Payer: Self-pay | Admitting: Neurology

## 2020-09-06 ENCOUNTER — Other Ambulatory Visit: Payer: Self-pay

## 2020-09-06 VITALS — BP 144/88 | HR 76 | Ht 73.0 in | Wt 364.0 lb

## 2020-09-06 DIAGNOSIS — G20 Parkinson's disease: Secondary | ICD-10-CM

## 2020-09-06 DIAGNOSIS — G249 Dystonia, unspecified: Secondary | ICD-10-CM

## 2020-09-06 NOTE — Patient Instructions (Signed)
The physicians and staff at [facility] are committed to providing excellent care. You may receive a survey requesting feedback about your experience at our office. We strive to receive "very good" responses to the survey questions. If you feel that your experience would prevent you from giving the office a "very good " response, please contact our office to try to remedy the situation. We may be reached at [phone]. Thank you for taking the time out of your busy day to complete the survey.  

## 2020-09-27 ENCOUNTER — Other Ambulatory Visit: Payer: Self-pay | Admitting: Neurology

## 2020-09-27 NOTE — Telephone Encounter (Signed)
Rx(s) sent to pharmacy electronically.  

## 2020-11-26 ENCOUNTER — Other Ambulatory Visit: Payer: Self-pay | Admitting: Neurology

## 2021-03-01 NOTE — Telephone Encounter (Signed)
Pt will need to make these requests via medical records.  Medical records are not handled within our office.  Please give him that number.  You can also give him the number to [facility] to make sure that images are pushed to powershare.

## 2021-03-20 ENCOUNTER — Other Ambulatory Visit: Payer: Self-pay | Admitting: Neurology

## 2021-03-20 NOTE — Progress Notes (Signed)
Assessment/Plan:   1.  Parkinsons Disease  -Patient is status post DBS to the bilateral STN on November 24, 2019.  -pt with EDS.  Have reduced requip (he only takes bid) and will reduce to 0.5 mg twice per day.   -Take carbidopa/levodopa 25/100, 3/2/2 (hes often only taking bid - we had reduced post surgery and he went back up on it but now mostly bid)  -given phone number to radiology to have images pushed to powershare   2.  Parkinson's dyskinesia  -Does have some dyskinesia in the right foot.  Slightly decreased stim today  3.  EDS  -have reduced requip and when he gets to [facility] they may be able to d/c all together)  -he was supposed to have PSG but didn't follow through.  Suspect that may be primary issue.  Will need f/u in [place]     Subjective:   [name] [name], [name] [name] was seen today in follow up for Parkinsons disease.  My previous records were reviewed prior to todays visit as well as outside records available to me. Pt in process of moving to [facility] (has already moved but not established Parkinsons Disease care yet).  Plans to transfer his care to [facility].  He is applying for disability.  Cannot stay awake in day to work - quit work in [employer].  Didn't complete PSG.  Does state that he was arrested coming home from [facility].  Was pulled over driving and told the cop that he had Parkinsons Disease but they arrested him for impaired driving (they thought he was drunk).  He asked them to give him a Associate Professor or blood test but they declined and took him to jail.  He spent the night in jail.  Ultimately charges dropped after breathalizer at jail showed no EtOH.  Had a fall - missed a step (blames on bifocals) and he did hit head.  He has fallen a few other times on the stairs and thinks that it is related to knee pain (now on bottom floor of new apartment)  Current prescribed movement disorder medications: carbidopa/levodopa 25/100: 3/2/2 (admits takes 3 in the AM and then 2 in the  evening) Ropinirole, 1 mg, 1 tablet bid    ALLERGIES:  No Known Allergies  CURRENT MEDICATIONS:  Outpatient Encounter Medications as of 03/21/2021  Medication Sig   atorvastatin (LIPITOR) 40 MG tablet Take 40 mg by mouth daily.   furosemide (LASIX) 20 MG tablet Take 20 mg by mouth daily. Patient takes PRN   hydrochlorothiazide (HYDRODIURIL) 12.5 MG tablet Take 12.5 mg by mouth daily.    PARoxetine (PAXIL) 10 MG tablet TAKE ONE TABLET BY MOUTH DAILY   Polyethyl Glycol-Propyl Glycol (SYSTANE OP) Place 1 drop into both eyes daily as needed (dry eyes).   rOPINIRole (REQUIP) 0.5 MG tablet Take 1 tablet (0.5 mg total) by mouth 2 (two) times daily.   sildenafil (VIAGRA) 100 MG tablet Take 100 mg by mouth as needed for erectile dysfunction.   terbinafine (LAMISIL) 1 % cream Apply 1 application topically daily as needed (ringworm).   [DISCONTINUED] carbidopa-levodopa (SINEMET IR) 25-100 MG tablet Take 3 in the morning 2 in the afternoon and 2 in the evening   [DISCONTINUED] rOPINIRole (REQUIP) 1 MG tablet Take 1 tablet (1 mg total) by mouth in the morning and at bedtime.   carbidopa-levodopa (SINEMET IR) 25-100 MG tablet Take 3 in the morning 2 in the afternoon and 2 in the evening   [DISCONTINUED] clonazePAM (  KLONOPIN) 0.5 MG tablet Take 0.5 tablets (0.25 mg total) by mouth at bedtime. (Patient not taking: No sig reported)   [DISCONTINUED] tamsulosin (FLOMAX) 0.4 MG CAPS capsule Take 0.4 mg by mouth daily after breakfast.  (Patient not taking: Reported on 03/21/2021)   No facility-administered encounter medications on file as of 03/21/2021.    Objective:   PHYSICAL EXAMINATION:    VITALS:   Vitals:   03/21/21 0939  BP: 124/84  Pulse: 74  SpO2: 95%  Weight: (!) 333 lb (151 kg)  Height: 6\' 1"  (1.854 m)     GEN:  The patient appears stated age and is in NAD. Lungs:  Some DOE  Neurological examination:  Orientation: The patient is alert and oriented x3. Cranial nerves: There is good  facial symmetry with no facial hypomimia. The speech is fluent and clear. Soft palate rises symmetrically and there is no tongue deviation. Hearing is intact to conversational tone. Sensation: Sensation is intact to light touch throughout Motor: Strength is at least antigravity x4.  Movement examination: Tone: There is nl tone in the UE/LE Abnormal movements: Some dyskinesia in the right foot and more rare in the R hand (pt only noticed in the last few days) Coordination:  There is good RAMs Gait and Station: The patient has no difficulty arising out of a deep-seated chair without the use of the hands. The patient's stride length is good, and perhaps even exaggerated arm swing on the right.     Total time spent on today's visit was 30 minutes, including both face-to-face time and nonface-to-face time.  Time included that spent on review of records (prior notes available to me/labs/imaging if pertinent), discussing treatment and goals, answering patient's questions and coordinating care.  This did not include DBS time.  Cc:  Pcp, No

## 2021-03-21 ENCOUNTER — Encounter: Payer: Self-pay | Admitting: Neurology

## 2021-03-21 ENCOUNTER — Ambulatory Visit (INDEPENDENT_AMBULATORY_CARE_PROVIDER_SITE_OTHER): Payer: BC Managed Care – PPO | Admitting: Neurology

## 2021-03-21 ENCOUNTER — Other Ambulatory Visit: Payer: Self-pay

## 2021-03-21 VITALS — BP 124/84 | HR 74 | Ht 73.0 in | Wt 333.0 lb

## 2021-03-21 DIAGNOSIS — G249 Dystonia, unspecified: Secondary | ICD-10-CM | POA: Diagnosis not present

## 2021-03-21 DIAGNOSIS — G20 Parkinson's disease: Secondary | ICD-10-CM | POA: Diagnosis not present

## 2021-03-21 MED ORDER — ROPINIROLE HCL 0.5 MG PO TABS: 0.5000 mg | ORAL_TABLET | Freq: Two times a day (BID) | ORAL | 1 refills | Status: AC

## 2021-03-21 MED ORDER — CARBIDOPA-LEVODOPA 25-100 MG PO TABS: ORAL_TABLET | ORAL | 1 refills | Status: DC

## 2021-03-21 NOTE — Patient Instructions (Addendum)
Reduce requip to 0.5 mg twice per day  You can take your carbidopa/levodopa 25/100, 3 in the AM and then you can take 2 in the afternoon/evening. Call Radiology [phone] and ask them to push ALL MRI brain images to PowerShare. Follow up PRN

## 2021-03-21 NOTE — Procedures (Signed)
DBS Programming was performed.    Manufacturer of DBS device: [employer]  Total time spent programming was 8 minutes.  Device was on.  Soft start was confirmed to be on.  Impedences were checked and were within normal limits.  Battery was checked and was determined to be functioning normally and not near the end of life.  Final settings were as follows:   Active Contacts Amplitude (mA) PW (ms) Frequency (hz) Side  effects  Left Brain                            03/21/21 (5/6/7)-C+ 2.7 (2.0-3.1) 60 136   09/06/20 (5/6/7)-C+ 2.8(2.0-3.1) 60 136   12/28/19 (5/6/7)-C+ 2.7(2.0-3.1) 60 136 Pt had on 3.0 when came in and had R foot dyskinesia  12/21/19 (5/6/7)-C+ 2.7(2.0-3.0 with pt control) 60 130   Complete monopolar review 1-C+ 2.5 60 130 Continued foot tremor   (2/3/4)-C+ 2.5 60 130 Did well   (5/6/7)-C+ 2.5 60 130 ?mild arm rigidity   (5/6/7)-C+ 3.0 60 130 Mild foot dyskinesia; no rigid   8-C+ 2.5 60 130 Slight decreased RAMS                       Right Brain                     03/21/21 (5/6/7)-C+ 2.8 60 136   09/06/20 (5/6/7)-C+ 2.8(2.0-3.0) 60 136   12/28/19 (5/6/7)-C+ 2.6 60 136   12/21/19 (5/6/7)-C+ 2.6 60 130   Complete monopolar review 1-C+ 2.5 60 130 Arm rigidity still   (2/3/4)-C+ 2.6 60 130 Better rigidity   (5/6/7)-C+ 2.6 60 130    8-C+ 2.6 60 130 Still good

## 2021-04-20 ENCOUNTER — Telehealth: Payer: Self-pay | Admitting: Neurology

## 2021-04-20 NOTE — Telephone Encounter (Signed)
Patient called and states that he needs all of his imaging sent to [facility] in [place]. He states that Dr Tat asked him to call and let the radiology department know to send them to [facility] for patient. He sates that he did call our Radiology dept at [phone] and they want to speak to someone in our office about what Dr Tat told him they state they do not think they call push them through what system she was telling the patient

## 2021-06-07 ENCOUNTER — Other Ambulatory Visit: Payer: Self-pay | Admitting: Neurology

## 2021-06-12 ENCOUNTER — Other Ambulatory Visit: Payer: Self-pay | Admitting: Neurology

## 2021-06-12 DIAGNOSIS — G20 Parkinson's disease: Secondary | ICD-10-CM

## 2021-06-13 ENCOUNTER — Other Ambulatory Visit: Payer: Self-pay

## 2022-01-04 IMAGING — CT CT DBS HEAD W/O CONTRAST
2 series · 15 of 40 positions shown, 18 images · non-contrast
Comparison: 11/17/2019

CLINICAL DATA: Postoperative state

EXAM:
CT HEAD WITHOUT CONTRAST
TECHNIQUE: Contiguous axial images were obtained from the base of the skull
through the vertex without intravenous contrast.

[Series 3: ax standard · axial · 0.53mm/px · z∈[-116,+42]mm · 12 of 185 slices shown, 15 images]
[im 13/185  brain]
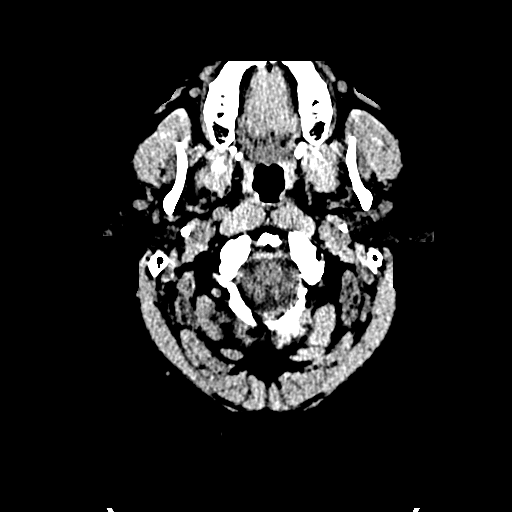
[im 13/185  bone]
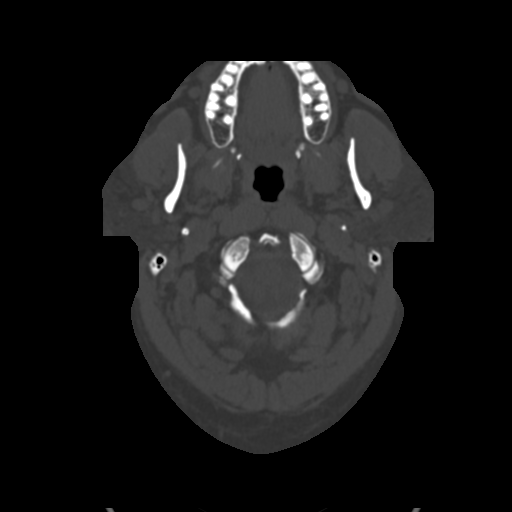
[im 26/185  brain]
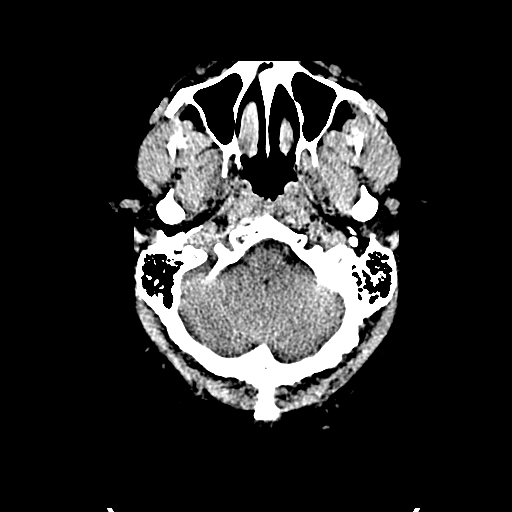
[im 39/185  brain]
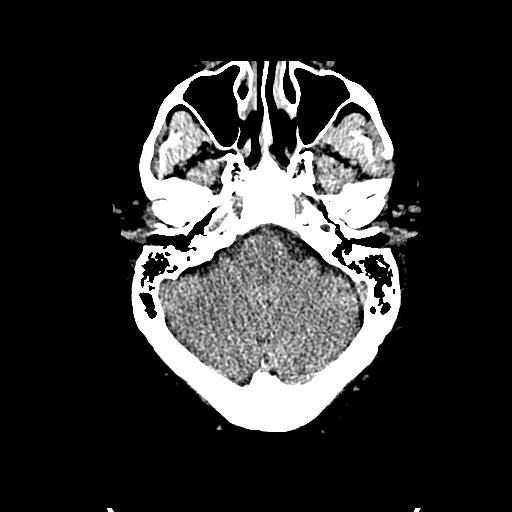
[im 58/185  brain]
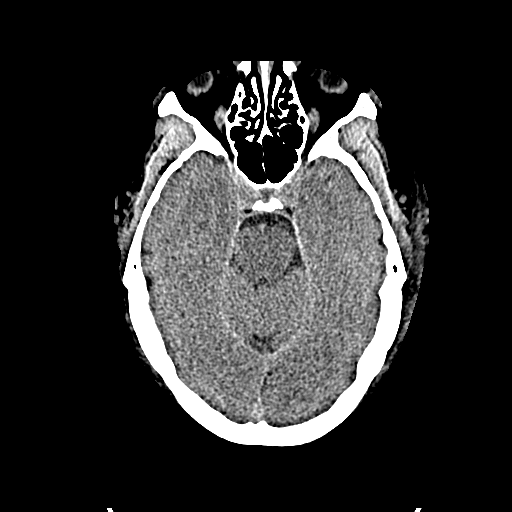
[im 70/185  brain]
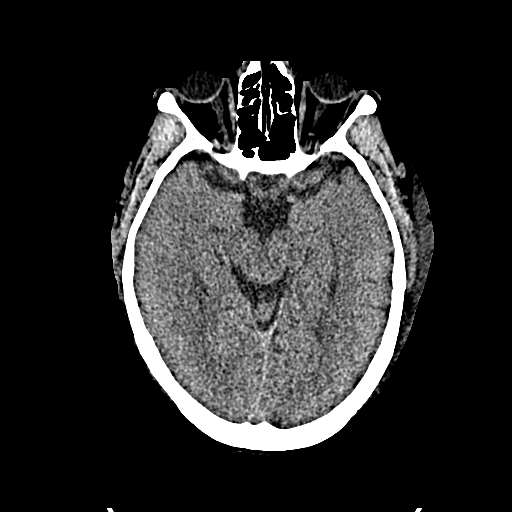
[im 70/185  bone]
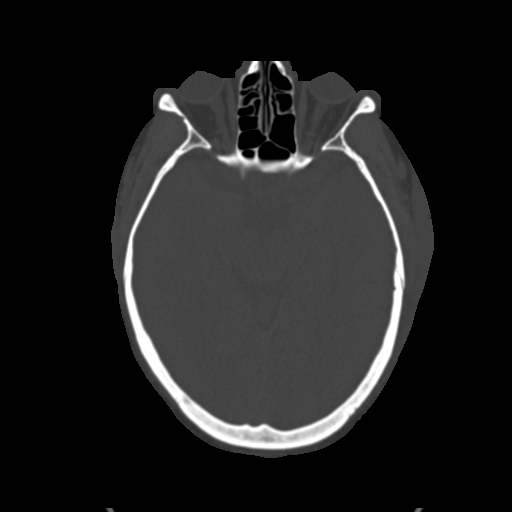
[im 83/185  brain]
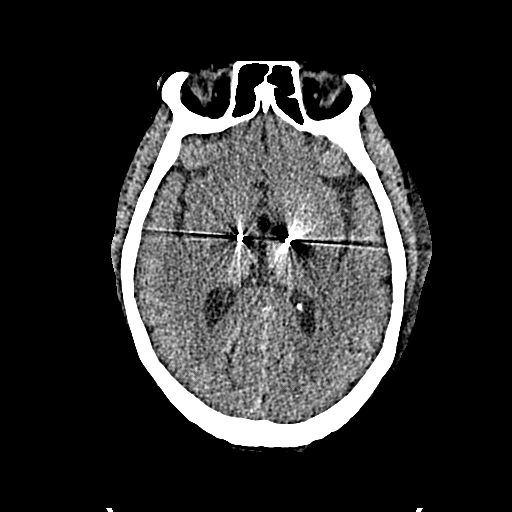
[im 102/185  brain]
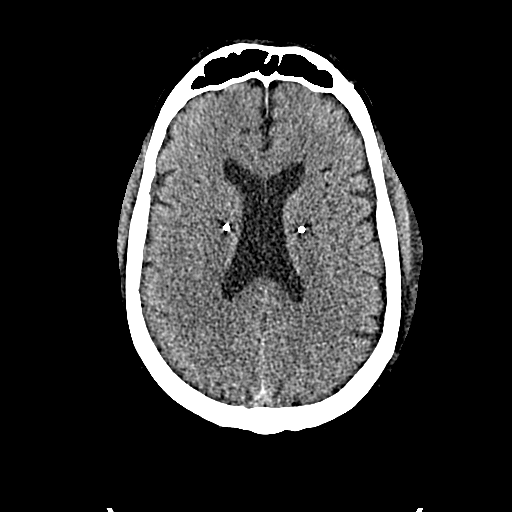
[im 115/185  brain]
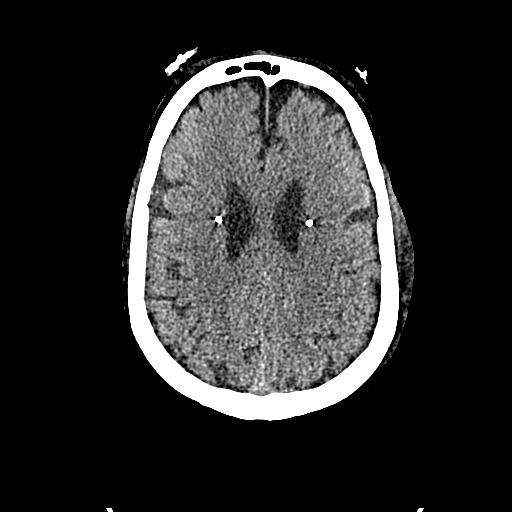
[im 127/185  brain]
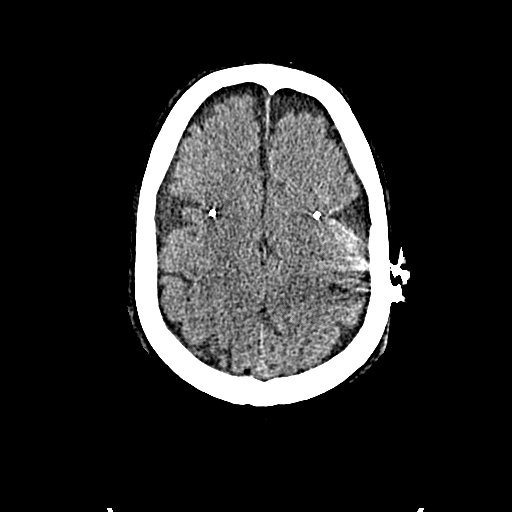
[im 127/185  bone]
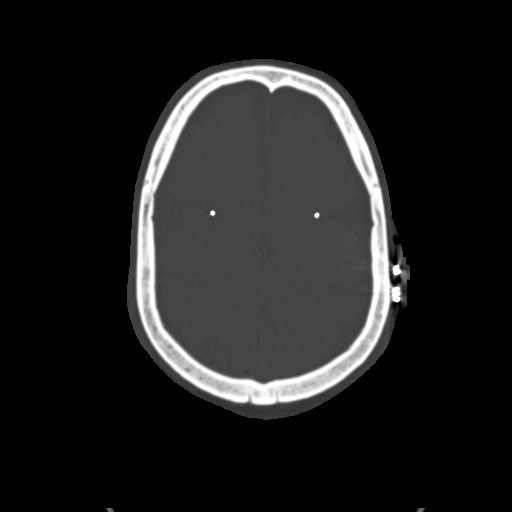
[im 146/185  brain]
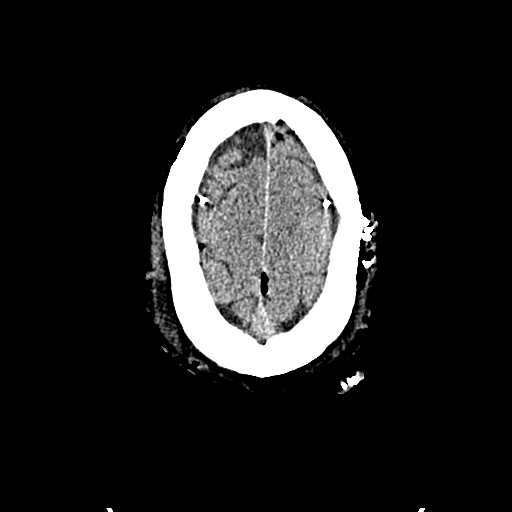
[im 159/185  brain]
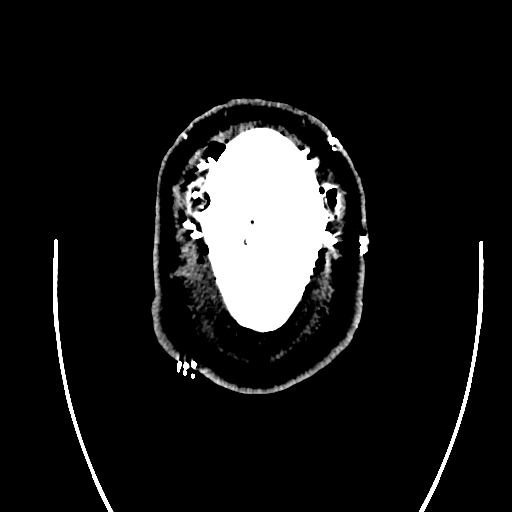
[im 172/185  brain]
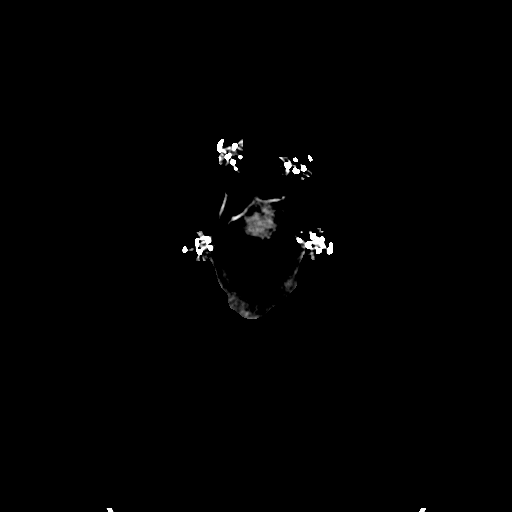

[Series 5: cor ax bone · coronal · 0.37mm/px · 3 of 126 slices shown]
[im 42/126  brain]
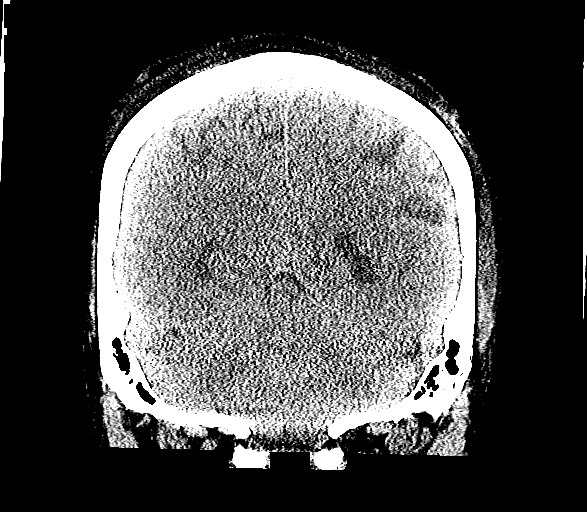
[im 56/126  brain]
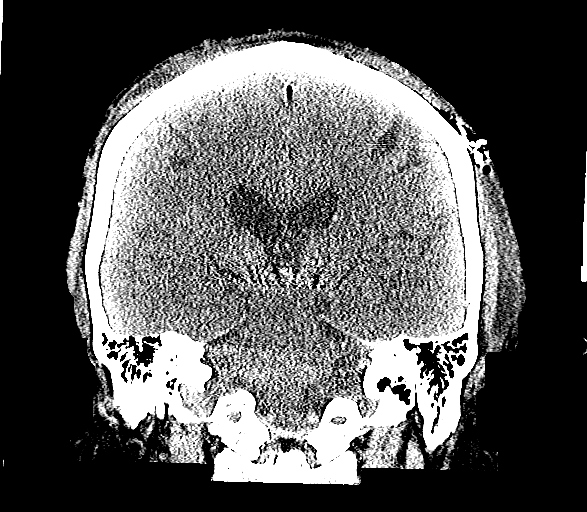
[im 70/126  brain]
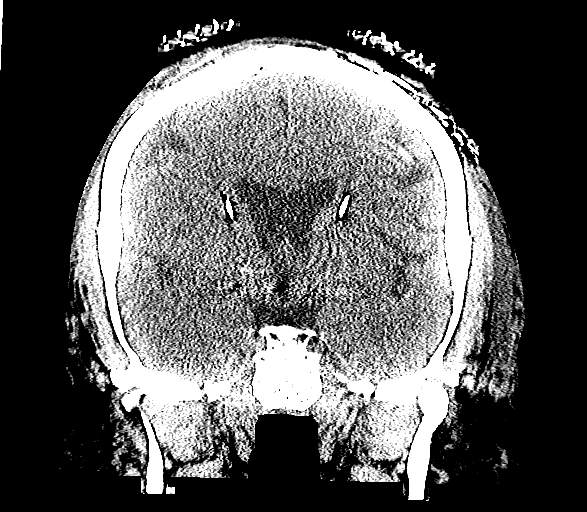

[15 of 40 positions shown; findings below may reference images not displayed]

FINDINGS: Brain: Bilateral deep brain stimulator placement. No postoperative
hemorrhage or swelling. There is expected pneumocephalus. No
complicating infarct or extra-axial collection.

Vascular: No hyperdense vessel or unexpected calcification.

Skull: Unremarkable bilateral burr hole.

Sinuses/Orbits: Negative
IMPRESSION: No unexpected finding after deep brain stimulator placement.
